# Patient Record
Sex: Female | Born: 1945 | Race: White | Hispanic: No | Smoking: Former smoker
Health system: Southern US, Community
[De-identification: ages and names within clinical notes are randomized; demographics above are authoritative.]

## PROBLEM LIST (undated history)

## (undated) DIAGNOSIS — B019 Varicella without complication: Secondary | ICD-10-CM

## (undated) DIAGNOSIS — K219 Gastro-esophageal reflux disease without esophagitis: Secondary | ICD-10-CM

## (undated) DIAGNOSIS — I10 Essential (primary) hypertension: Secondary | ICD-10-CM

## (undated) DIAGNOSIS — E7800 Pure hypercholesterolemia, unspecified: Secondary | ICD-10-CM

## (undated) DIAGNOSIS — C541 Malignant neoplasm of endometrium: Secondary | ICD-10-CM

## (undated) HISTORY — PX: OOPHORECTOMY: SHX86

## (undated) HISTORY — DX: Malignant neoplasm of endometrium: C54.1

## (undated) HISTORY — DX: Varicella without complication: B01.9

## (undated) HISTORY — DX: Pure hypercholesterolemia, unspecified: E78.00

## (undated) HISTORY — DX: Gastro-esophageal reflux disease without esophagitis: K21.9

## (undated) HISTORY — PX: APPENDECTOMY: SHX54

## (undated) HISTORY — DX: Essential (primary) hypertension: I10

## (undated) HISTORY — PX: ABDOMINAL HYSTERECTOMY: SHX81

---

## 2002-05-11 ENCOUNTER — Other Ambulatory Visit: Admission: RE | Admit: 2002-05-11 | Discharge: 2002-05-11 | Payer: Self-pay | Admitting: Obstetrics & Gynecology

## 2002-06-17 ENCOUNTER — Ambulatory Visit: Admission: RE | Admit: 2002-06-17 | Discharge: 2002-06-17 | Payer: Self-pay | Admitting: Gynecology

## 2002-06-17 ENCOUNTER — Ambulatory Visit (HOSPITAL_COMMUNITY): Admission: RE | Admit: 2002-06-17 | Discharge: 2002-06-17 | Payer: Self-pay | Admitting: Obstetrics & Gynecology

## 2002-06-17 ENCOUNTER — Encounter: Payer: Self-pay | Admitting: Obstetrics & Gynecology

## 2004-12-27 ENCOUNTER — Ambulatory Visit: Payer: Self-pay | Admitting: Internal Medicine

## 2005-09-11 ENCOUNTER — Ambulatory Visit: Payer: Self-pay | Admitting: Gastroenterology

## 2006-02-05 ENCOUNTER — Ambulatory Visit: Payer: Self-pay | Admitting: Internal Medicine

## 2007-02-11 ENCOUNTER — Ambulatory Visit: Payer: Self-pay | Admitting: Internal Medicine

## 2008-03-15 ENCOUNTER — Ambulatory Visit: Payer: Self-pay | Admitting: Internal Medicine

## 2009-04-27 ENCOUNTER — Ambulatory Visit: Payer: Self-pay | Admitting: Internal Medicine

## 2009-12-28 ENCOUNTER — Encounter: Payer: Self-pay | Admitting: Physician Assistant

## 2010-07-31 ENCOUNTER — Ambulatory Visit: Payer: Self-pay | Admitting: Internal Medicine

## 2010-08-04 ENCOUNTER — Ambulatory Visit: Payer: Self-pay | Admitting: Internal Medicine

## 2010-08-16 ENCOUNTER — Ambulatory Visit: Payer: Self-pay | Admitting: Internal Medicine

## 2010-08-20 LAB — HM COLONOSCOPY

## 2010-08-24 ENCOUNTER — Ambulatory Visit: Payer: Self-pay | Admitting: Internal Medicine

## 2010-09-01 ENCOUNTER — Ambulatory Visit: Payer: Self-pay | Admitting: Internal Medicine

## 2010-09-04 ENCOUNTER — Ambulatory Visit: Payer: Self-pay | Admitting: Unknown Physician Specialty

## 2010-09-24 ENCOUNTER — Ambulatory Visit: Payer: Self-pay | Admitting: Internal Medicine

## 2010-10-31 ENCOUNTER — Ambulatory Visit: Payer: Self-pay | Admitting: Internal Medicine

## 2010-11-23 ENCOUNTER — Ambulatory Visit: Payer: Self-pay | Admitting: Internal Medicine

## 2011-10-09 ENCOUNTER — Ambulatory Visit: Payer: Self-pay | Admitting: Internal Medicine

## 2012-07-21 ENCOUNTER — Telehealth: Payer: Self-pay | Admitting: Internal Medicine

## 2012-07-21 NOTE — Telephone Encounter (Signed)
Caller: Macaria/Patient; Phone: [phone]; Reason for Call: Patient of Dr.  Roby Lofts moving with her to [facility].  Hasn't seen her in this office yet.  Next appt 08-11-12.  Will need refills for Avalide 150/12. 5 and HCTZ 12.  5 prior to visit please.  Pharmacy [facility]: [phone].

## 2012-07-21 NOTE — Telephone Encounter (Signed)
Ok to refill both x 2.

## 2012-07-22 MED ORDER — IRBESARTAN-HYDROCHLOROTHIAZIDE 150-12.5 MG PO TABS: 1.0000 | ORAL_TABLET | Freq: Every day | ORAL | Status: DC

## 2012-07-22 NOTE — Telephone Encounter (Signed)
Called pharmacy to request refills for patient.

## 2012-08-11 ENCOUNTER — Encounter: Payer: Self-pay | Admitting: Internal Medicine

## 2012-08-11 ENCOUNTER — Ambulatory Visit (INDEPENDENT_AMBULATORY_CARE_PROVIDER_SITE_OTHER): Payer: Medicare Other | Admitting: Internal Medicine

## 2012-08-11 VITALS — BP 124/76 | HR 68 | Temp 98.8°F | Ht 63.5 in | Wt 212.5 lb

## 2012-08-11 DIAGNOSIS — G4730 Sleep apnea, unspecified: Secondary | ICD-10-CM

## 2012-08-11 DIAGNOSIS — E7800 Pure hypercholesterolemia, unspecified: Secondary | ICD-10-CM | POA: Insufficient documentation

## 2012-08-11 DIAGNOSIS — I10 Essential (primary) hypertension: Secondary | ICD-10-CM | POA: Insufficient documentation

## 2012-08-11 DIAGNOSIS — R7309 Other abnormal glucose: Secondary | ICD-10-CM

## 2012-08-11 DIAGNOSIS — C541 Malignant neoplasm of endometrium: Secondary | ICD-10-CM

## 2012-08-11 DIAGNOSIS — Z8542 Personal history of malignant neoplasm of other parts of uterus: Secondary | ICD-10-CM | POA: Insufficient documentation

## 2012-08-11 DIAGNOSIS — R739 Hyperglycemia, unspecified: Secondary | ICD-10-CM | POA: Insufficient documentation

## 2012-08-11 DIAGNOSIS — C549 Malignant neoplasm of corpus uteri, unspecified: Secondary | ICD-10-CM

## 2012-08-11 MED ORDER — HYDROCHLOROTHIAZIDE 12.5 MG PO CAPS: 12.5000 mg | ORAL_CAPSULE | Freq: Every day | ORAL | Status: DC

## 2012-08-11 MED ORDER — IRBESARTAN-HYDROCHLOROTHIAZIDE 150-12.5 MG PO TABS: 1.0000 | ORAL_TABLET | Freq: Every day | ORAL | Status: DC

## 2012-08-11 NOTE — Patient Instructions (Signed)
It was nice seeing you today.  I am glad you have been doing well.  Let me know if you need anything. 

## 2012-08-24 ENCOUNTER — Encounter: Payer: Self-pay | Admitting: Internal Medicine

## 2012-08-24 DIAGNOSIS — G4730 Sleep apnea, unspecified: Secondary | ICD-10-CM | POA: Insufficient documentation

## 2012-08-24 NOTE — Assessment & Plan Note (Signed)
Continue CPAP.  Follow.   

## 2012-08-24 NOTE — Assessment & Plan Note (Signed)
Low cholesterol diet.  Check lipid panel with next labs.  

## 2012-08-24 NOTE — Assessment & Plan Note (Signed)
Blood pressure under good control.  Same meds.  Check metabolic panel with next labs.    

## 2012-08-24 NOTE — Assessment & Plan Note (Signed)
Low carb diet.  Continue exercise.  Weight loss.  Check met b and a1c.

## 2012-08-24 NOTE — Assessment & Plan Note (Signed)
Followed by GYN - Dr Jackson-Moore.  Up to date.    

## 2012-08-24 NOTE — Progress Notes (Signed)
Subjective:    Patient ID: Barbara Burns, female    DOB: 02-Jun-1946, 66 y.o.   MRN: [id number]  HPI 66 year old female with past history of hypertension, hyperglycemia and hypercholesterolemia who comes in today for a scheduled follow up.  She states she is doing relatively well.  Going to [employer].  Feels better exercising.  No chest pain of tightness with increased activity or exertion.  Bowels stable.  Up to date with her GYN exams and mammograms.  Just had her mammogram this past summer.  Blood pressures have been doing well - averaging 118-136/70s.  She is still having trouble with her heels.  Saw podiatry.  They are following.    Past Medical History  Diagnosis Date  . Hypertension   . Hypercholesterolemia   . Endometrial cancer     s/p hysterectomy - 2003  . GERD (gastroesophageal reflux disease)   . Chicken pox     Outpatient Encounter Prescriptions as of 08/11/2012  Medication Sig Dispense Refill  . aspirin 81 MG tablet Take 81 mg by mouth daily.      . Calcium Carbonate (CALCIUM 600 PO) Take 1 tablet by mouth 2 (two) times daily.      . fexofenadine (ALLEGRA) 180 MG tablet Take 180 mg by mouth daily.      . fish oil-omega-3 fatty acids 1000 MG capsule Take 1 g by mouth daily.      . hydrochlorothiazide (MICROZIDE) 12.5 MG capsule Take 1 capsule (12.5 mg total) by mouth daily.  90 capsule  3  . irbesartan-hydrochlorothiazide (AVALIDE) 150-12.5 MG per tablet Take 1 tablet by mouth daily.  90 tablet  3  . omeprazole (PRILOSEC) 20 MG capsule Take 20 mg by mouth daily.      . [DISCONTINUED] hydrochlorothiazide (MICROZIDE) 12.5 MG capsule Take 12.5 mg by mouth daily.      . [DISCONTINUED] irbesartan-hydrochlorothiazide (AVALIDE) 150-12.5 MG per tablet Take 1 tablet by mouth daily.  30 tablet  2  . fluticasone (FLONASE) 50 MCG/ACT nasal spray Place 2 sprays into the nose daily.        Review of Systems Patient denies any headache, lightheadedness or dizziness.  No significant sinus  or allergy symptoms.  No chest pain, tightness or palpitations.  No increased shortness of breath, cough or congestion.  No nausea or vomiting.  No increased acid reflux.   No abdominal pain or cramping.  No bowel change, such as diarrhea, constipation, BRBPR or melana.  No urine change.  No vaginal problems.  Persistent problem with heel pain.       Objective:   Physical Exam Filed Vitals:   08/11/12 1422  BP: 124/76  Pulse: 68  Temp: 98.8 F (29.44 C)   66 year old female in no acute distress.   HEENT:  Nares - clear.  OP- without lesions or erythema.  NECK:  Supple, nontender.  No audible bruit.   HEART:  Appears to be regular. LUNGS:  Without crackles or wheezing audible.  Respirations even and unlabored.   RADIAL PULSE:  Equal bilaterally.  ABDOMEN:  Soft, nontender.  No audible abdominal bruit.   EXTREMITIES:  No increased edema to be present.                     Assessment & Plan:  HEEL PAIN.  Continue follow up with podiatry.    CARDIOVASCULAR.  Currently asymptomatic.  Exercising.  Continue risk factor modification.   GI.  Colonoscopy 09/04/10 revealed diverticulosis.  Upper symptoms controlled.  Follow.   INCREASED PSYCHOSOCIAL STRESSORS.  Doing well.  Follow.   HEALTH MAINTENANCE.  Physical 10/19/10.  Pelvics and paps through GYN (Dr Tamela Oddi).  Colonoscopy as outlined.  Mammogram 10/09/11 - BiRADS II.

## 2012-08-28 ENCOUNTER — Other Ambulatory Visit (INDEPENDENT_AMBULATORY_CARE_PROVIDER_SITE_OTHER): Payer: Medicare Other

## 2012-08-28 DIAGNOSIS — I10 Essential (primary) hypertension: Secondary | ICD-10-CM

## 2012-08-28 DIAGNOSIS — E7800 Pure hypercholesterolemia, unspecified: Secondary | ICD-10-CM

## 2012-08-28 DIAGNOSIS — R7309 Other abnormal glucose: Secondary | ICD-10-CM

## 2012-08-28 DIAGNOSIS — C541 Malignant neoplasm of endometrium: Secondary | ICD-10-CM

## 2012-08-28 DIAGNOSIS — R739 Hyperglycemia, unspecified: Secondary | ICD-10-CM

## 2012-08-28 DIAGNOSIS — C549 Malignant neoplasm of corpus uteri, unspecified: Secondary | ICD-10-CM

## 2012-08-28 LAB — HEPATIC FUNCTION PANEL
ALT: 49 U/L — ABNORMAL HIGH (ref 0–35)
Albumin: 3.8 g/dL (ref 3.5–5.2)
Total Bilirubin: 1.3 mg/dL — ABNORMAL HIGH (ref 0.3–1.2)
Total Protein: 7.3 g/dL (ref 6.0–8.3)

## 2012-08-28 LAB — BASIC METABOLIC PANEL
GFR: 68.51 mL/min (ref >60.00)
Glucose, Bld: 124 mg/dL — ABNORMAL HIGH (ref 70–99)
Potassium: 3.6 mEq/L (ref 3.5–5.1)
Sodium: 138 mEq/L (ref 135–145)

## 2012-08-28 LAB — LIPID PANEL
HDL: 41.4 mg/dL (ref >39.00)
Triglycerides: 111 mg/dL (ref 0.0–149.0)

## 2012-08-28 LAB — LDL CHOLESTEROL, DIRECT: Direct LDL: 164.8 mg/dL

## 2012-08-29 ENCOUNTER — Telehealth: Payer: Self-pay | Admitting: General Practice

## 2012-08-29 NOTE — Telephone Encounter (Signed)
Please call this number [phone] if no one answers please leave a message for the reason you are calling.

## 2012-08-29 NOTE — Telephone Encounter (Signed)
Pt called to get lab results. Stated she received several calls yesterday. If you call during the day please use [phone]. If after 5pm please call [phone].

## 2012-08-30 ENCOUNTER — Telehealth: Payer: Self-pay | Admitting: Internal Medicine

## 2012-08-30 NOTE — Telephone Encounter (Signed)
Pt notified of lab results and need for follow up liver panel.  Will recheck 09/09/12 - 8:00.  Pt aware - just need to put on lab schedule.  Thanks.

## 2012-09-02 NOTE — Telephone Encounter (Signed)
Made appointment

## 2012-09-08 ENCOUNTER — Telehealth: Payer: Self-pay | Admitting: *Deleted

## 2012-09-08 NOTE — Telephone Encounter (Signed)
This pt is coming in for labs tomorrow (Dec. 17 th, 2013) What labs and diagnostic would you like for this pt? Thank you

## 2012-09-09 ENCOUNTER — Other Ambulatory Visit: Payer: Self-pay | Admitting: *Deleted

## 2012-09-09 ENCOUNTER — Other Ambulatory Visit (INDEPENDENT_AMBULATORY_CARE_PROVIDER_SITE_OTHER): Payer: Medicare Other

## 2012-09-09 DIAGNOSIS — R945 Abnormal results of liver function studies: Secondary | ICD-10-CM

## 2012-09-09 LAB — HEPATIC FUNCTION PANEL
Albumin: 4 g/dL (ref 3.5–5.2)
Alkaline Phosphatase: 68 U/L (ref 39–117)
Total Protein: 7.4 g/dL (ref 6.0–8.3)

## 2012-09-09 NOTE — Telephone Encounter (Signed)
Just needs liver panel checked (dx 794.8).  Thanks.

## 2012-09-11 ENCOUNTER — Telehealth: Payer: Self-pay | Admitting: Internal Medicine

## 2012-09-11 DIAGNOSIS — R945 Abnormal results of liver function studies: Secondary | ICD-10-CM

## 2012-09-11 NOTE — Telephone Encounter (Signed)
Pt notified of lab results.  I placed order for follow up labs on 10/06/12 - 8:00.  Pt aware of time.  Please put on schedule.  Thanks.

## 2012-09-15 ENCOUNTER — Ambulatory Visit: Payer: Self-pay | Admitting: Internal Medicine

## 2012-09-15 NOTE — Telephone Encounter (Signed)
Scheduled

## 2012-09-16 ENCOUNTER — Telehealth: Payer: Self-pay | Admitting: Internal Medicine

## 2012-09-16 NOTE — Telephone Encounter (Signed)
Left detailed message on patient's answering service per patients request.

## 2012-09-16 NOTE — Telephone Encounter (Signed)
Notify pt abdominal ultrasound reveals fatty liver.  Normal gallbladder.  No other acute abnormalities noted.  Continue diet, exercise and weight loss.

## 2012-10-06 ENCOUNTER — Other Ambulatory Visit: Payer: Medicare Other

## 2012-10-09 ENCOUNTER — Other Ambulatory Visit (INDEPENDENT_AMBULATORY_CARE_PROVIDER_SITE_OTHER): Payer: Medicare Other

## 2012-10-09 DIAGNOSIS — R7989 Other specified abnormal findings of blood chemistry: Secondary | ICD-10-CM

## 2012-10-09 DIAGNOSIS — R945 Abnormal results of liver function studies: Secondary | ICD-10-CM

## 2012-10-09 LAB — HEPATIC FUNCTION PANEL
AST: 32 U/L (ref 0–37)
Albumin: 3.8 g/dL (ref 3.5–5.2)
Alkaline Phosphatase: 69 U/L (ref 39–117)
Total Bilirubin: 1.2 mg/dL (ref 0.3–1.2)

## 2012-10-11 ENCOUNTER — Other Ambulatory Visit: Payer: Self-pay | Admitting: Internal Medicine

## 2012-10-11 DIAGNOSIS — R7989 Other specified abnormal findings of blood chemistry: Secondary | ICD-10-CM

## 2012-10-11 DIAGNOSIS — R739 Hyperglycemia, unspecified: Secondary | ICD-10-CM

## 2012-10-11 DIAGNOSIS — R945 Abnormal results of liver function studies: Secondary | ICD-10-CM

## 2012-10-11 DIAGNOSIS — C541 Malignant neoplasm of endometrium: Secondary | ICD-10-CM

## 2012-10-11 DIAGNOSIS — R5383 Other fatigue: Secondary | ICD-10-CM

## 2012-10-11 DIAGNOSIS — I10 Essential (primary) hypertension: Secondary | ICD-10-CM

## 2012-10-11 DIAGNOSIS — E7800 Pure hypercholesterolemia, unspecified: Secondary | ICD-10-CM

## 2012-10-11 NOTE — Progress Notes (Signed)
Orders placed for follow up lab.

## 2012-10-13 ENCOUNTER — Encounter: Payer: Self-pay | Admitting: Internal Medicine

## 2012-10-15 ENCOUNTER — Encounter: Payer: Self-pay | Admitting: *Deleted

## 2012-11-15 ENCOUNTER — Telehealth: Payer: Self-pay | Admitting: Internal Medicine

## 2012-11-15 MED ORDER — IRBESARTAN-HYDROCHLOROTHIAZIDE 150-12.5 MG PO TABS: 1.0000 | ORAL_TABLET | Freq: Every day | ORAL | Status: DC

## 2012-11-18 ENCOUNTER — Telehealth: Payer: Self-pay | Admitting: *Deleted

## 2012-11-18 NOTE — Telephone Encounter (Signed)
Left message for patient to return call.

## 2012-11-18 NOTE — Telephone Encounter (Signed)
OK to refill? Not on med list. 

## 2012-11-18 NOTE — Telephone Encounter (Signed)
See rx request. 

## 2012-11-18 NOTE — Telephone Encounter (Signed)
Refilled blood pressure medication

## 2012-11-18 NOTE — Telephone Encounter (Signed)
Refill request  Ibuprofen 800 mg tablet  #40  Take 1 tablet by mouth every 8 hours as needed for pain

## 2012-11-19 NOTE — Telephone Encounter (Signed)
Patient returned your call.

## 2012-11-20 MED ORDER — HYDROCHLOROTHIAZIDE 12.5 MG PO CAPS: 12.5000 mg | ORAL_CAPSULE | Freq: Every day | ORAL | Status: DC

## 2012-11-20 NOTE — Telephone Encounter (Signed)
Patient stated that she does not take the ibuprofen 800 mg. She is needing HCTZ refill. Sent in to pharmacy.

## 2012-12-08 ENCOUNTER — Telehealth: Payer: Self-pay | Admitting: Internal Medicine

## 2012-12-08 NOTE — Telephone Encounter (Signed)
Called patient to find out which pharmacy she wants her medication sent in to?

## 2012-12-08 NOTE — Telephone Encounter (Signed)
irbesartan-hydrochlorothiazide (AVALIDE) 150-12.5 MG per tablet   #90  Patient is out of medication

## 2012-12-08 NOTE — Telephone Encounter (Signed)
[facility] uses [facility] [place]

## 2012-12-09 MED ORDER — IRBESARTAN-HYDROCHLOROTHIAZIDE 150-12.5 MG PO TABS: 1.0000 | ORAL_TABLET | Freq: Every day | ORAL | Status: DC

## 2012-12-09 NOTE — Telephone Encounter (Signed)
Resent prescription in to pharmacy.

## 2013-01-14 IMAGING — US ABDOMEN ULTRASOUND
1 series · 14 of 25 positions shown · non-contrast
Comparison: none

REASON FOR EXAM: Abnormal liver function test
COMMENTS:

PROCEDURE:     US  - US ABDOMEN GENERAL SURVEY  - September 15, 2012  [DATE]
RESULT:     Comparison: None.
TECHNIQUE: Multiple grayscale and color Doppler images were obtained of the
abdomen.

[Series 1: abdomen ultrasound · 0.26mm/px · 14 of 130 slices shown]
[im 1/130]
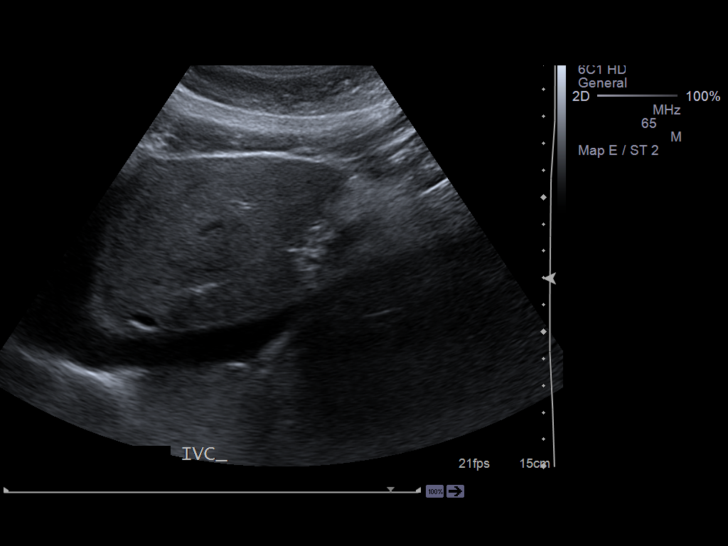
[im 11/130]
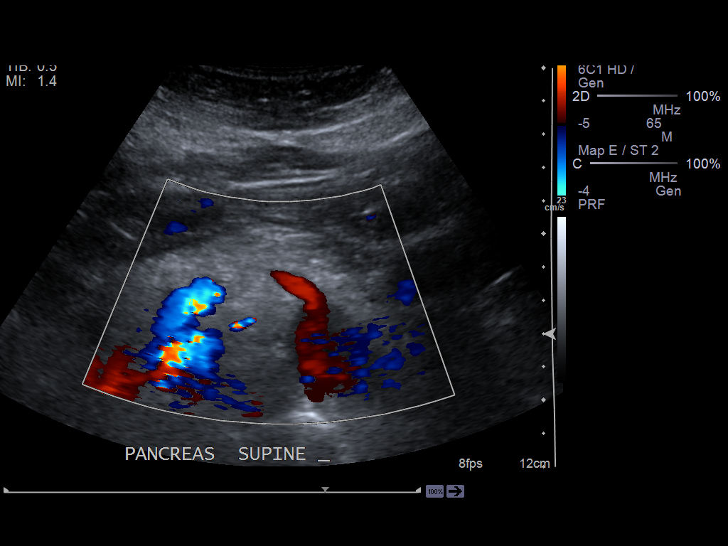
[im 22/130]
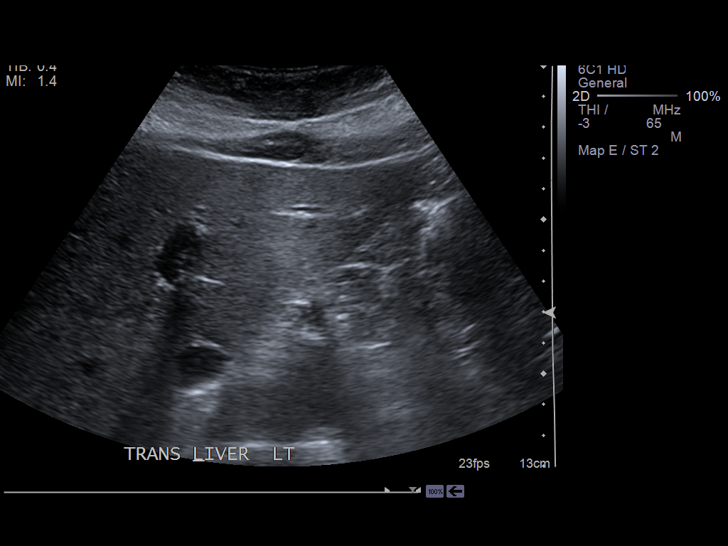
[im 33/130]
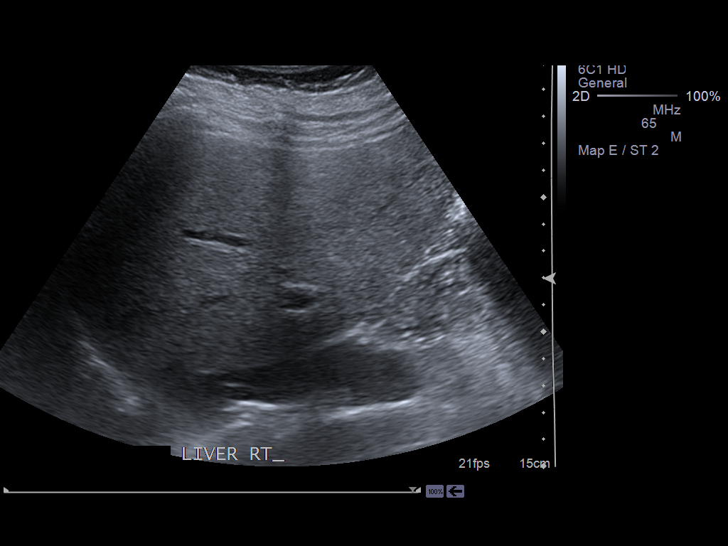
[im 44/130]
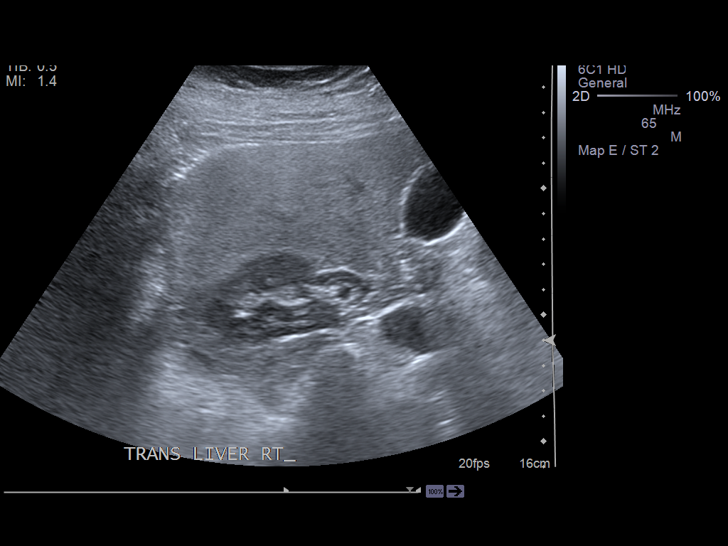
[im 49/130]
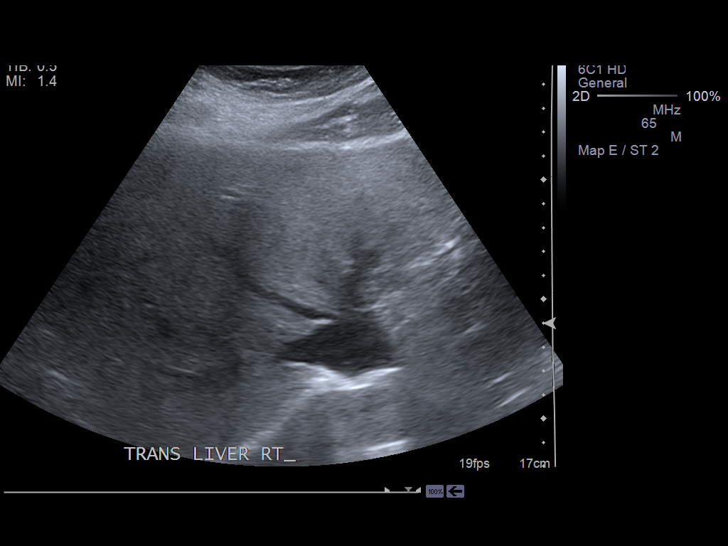
[im 60/130]
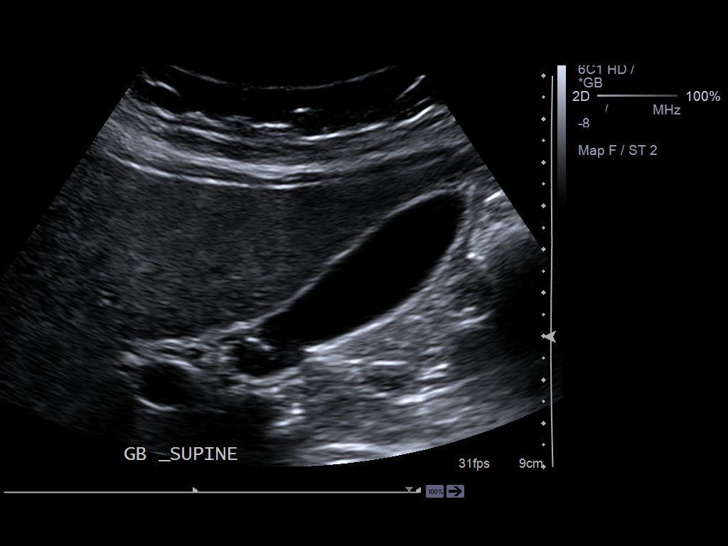
[im 70/130]
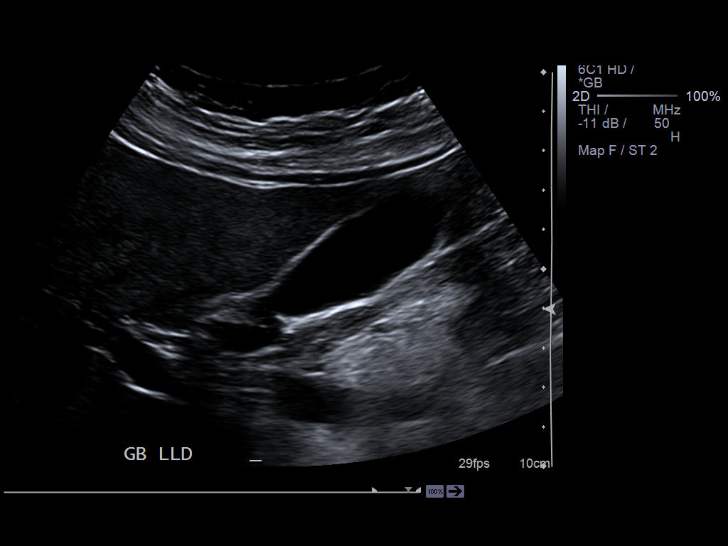
[im 81/130]
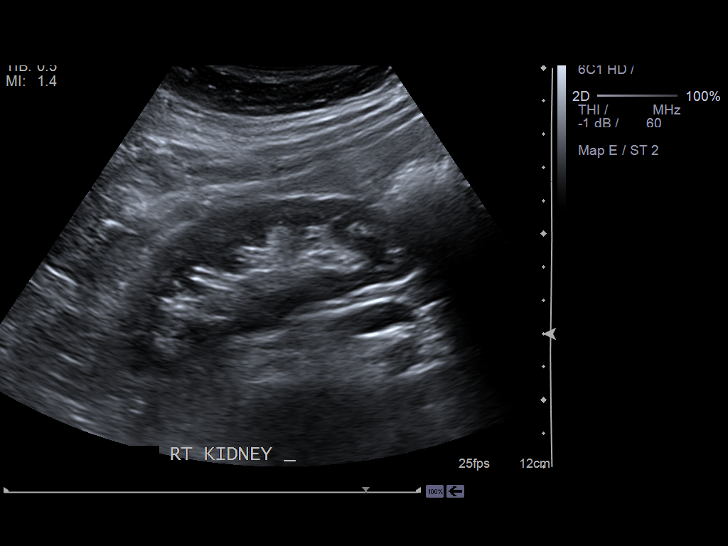
[im 87/130]
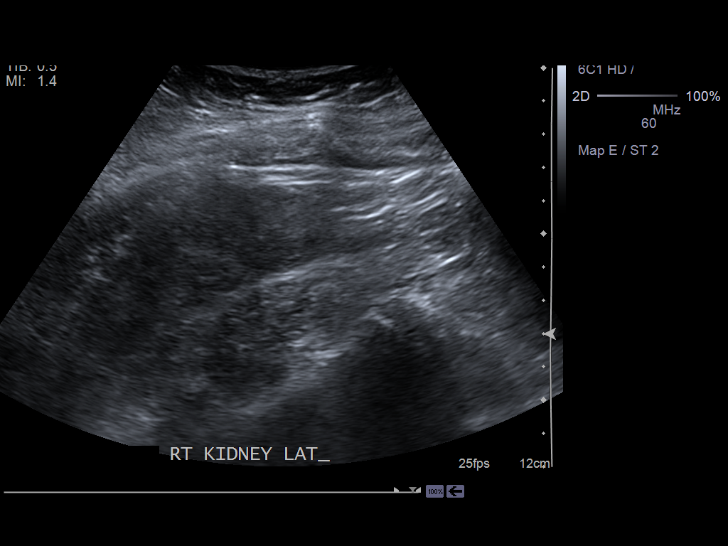
[im 97/130]
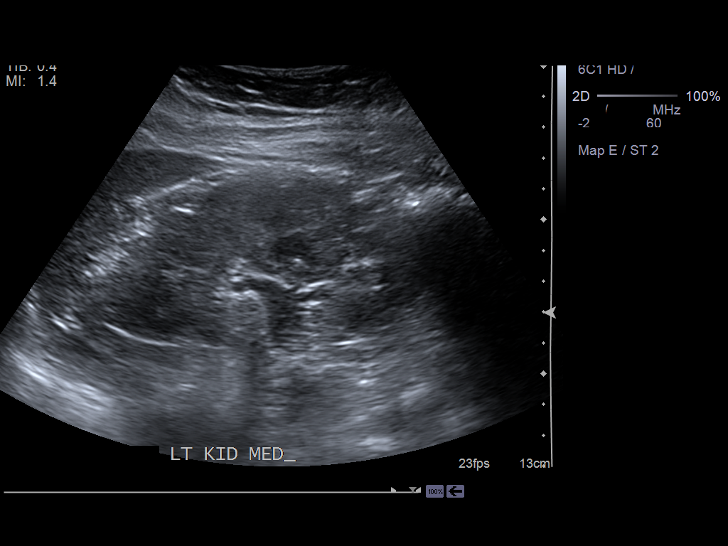
[im 108/130]
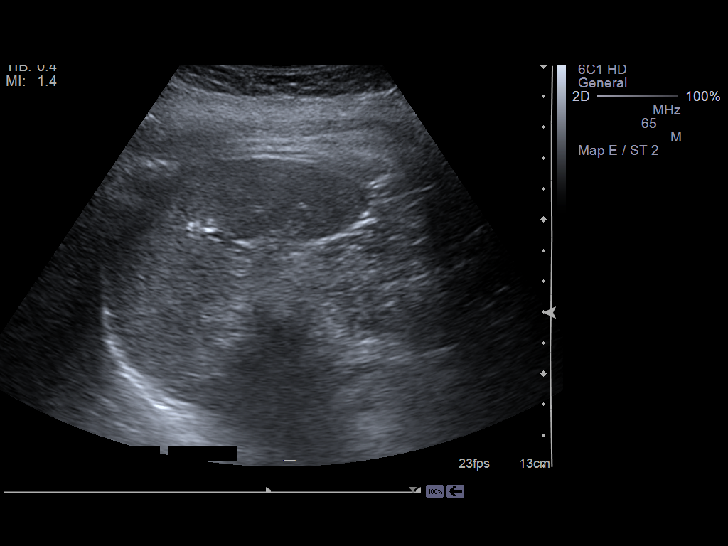
[im 119/130]
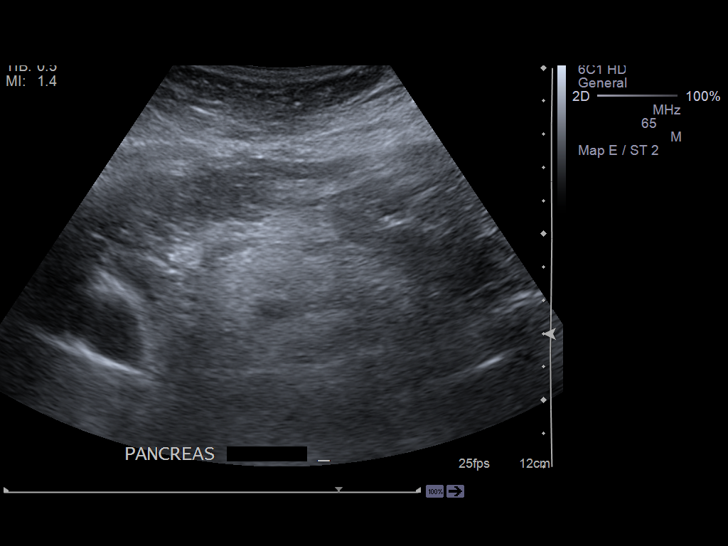
[im 130/130]
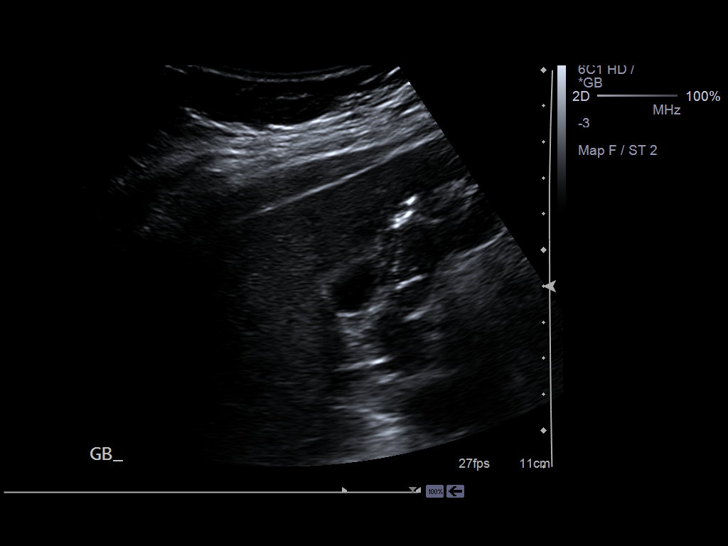

[14 of 25 positions shown; findings below may reference images not displayed]

FINDINGS: The abdominal aorta is normal in caliber. The pancreas was partially
obscured by overlying bowel gas. The visualized portion of the pancreatic
body is unremarkable. The liver is relatively hyperechoic, as can be seen
with hepatic steatosis. The main portal vein is patent. The common bile duct
measures 4 mm in diameter. The gallbladder is normal. Sonographic Murphy
sign was negative.

The visualized spleen is unremarkable. Images of the kidneys showed no
hydronephrosis. The left kidney is somewhat lobular in contour, which is
nonspecific.
IMPRESSION: 1. Hepatic steatosis.
2. Normal gallbladder.

[REDACTED]

## 2013-01-30 ENCOUNTER — Ambulatory Visit: Payer: Self-pay | Admitting: Internal Medicine

## 2013-01-30 LAB — HM MAMMOGRAPHY

## 2013-02-05 ENCOUNTER — Other Ambulatory Visit (INDEPENDENT_AMBULATORY_CARE_PROVIDER_SITE_OTHER): Payer: Medicare Other

## 2013-02-05 DIAGNOSIS — R7309 Other abnormal glucose: Secondary | ICD-10-CM

## 2013-02-05 DIAGNOSIS — R5381 Other malaise: Secondary | ICD-10-CM

## 2013-02-05 DIAGNOSIS — I10 Essential (primary) hypertension: Secondary | ICD-10-CM

## 2013-02-05 DIAGNOSIS — R7989 Other specified abnormal findings of blood chemistry: Secondary | ICD-10-CM

## 2013-02-05 DIAGNOSIS — C549 Malignant neoplasm of corpus uteri, unspecified: Secondary | ICD-10-CM

## 2013-02-05 DIAGNOSIS — R945 Abnormal results of liver function studies: Secondary | ICD-10-CM

## 2013-02-05 DIAGNOSIS — E7800 Pure hypercholesterolemia, unspecified: Secondary | ICD-10-CM

## 2013-02-05 DIAGNOSIS — R5383 Other fatigue: Secondary | ICD-10-CM

## 2013-02-05 DIAGNOSIS — C541 Malignant neoplasm of endometrium: Secondary | ICD-10-CM

## 2013-02-05 DIAGNOSIS — R739 Hyperglycemia, unspecified: Secondary | ICD-10-CM

## 2013-02-05 LAB — CBC WITH DIFFERENTIAL/PLATELET
Basophils Absolute: 0.1 10*3/uL (ref 0.0–0.1)
Basophils Relative: 0.7 % (ref 0.0–3.0)
Eosinophils Absolute: 0.2 10*3/uL (ref 0.0–0.7)
Hemoglobin: 14.5 g/dL (ref 12.0–15.0)
MCHC: 34.9 g/dL (ref 30.0–36.0)
MCV: 87.5 fl (ref 78.0–100.0)
Monocytes Absolute: 0.7 10*3/uL (ref 0.1–1.0)
Neutro Abs: 4 10*3/uL (ref 1.4–7.7)
RBC: 4.75 Mil/uL (ref 3.87–5.11)
RDW: 12.9 % (ref 11.5–14.6)

## 2013-02-05 LAB — HEPATIC FUNCTION PANEL
ALT: 34 U/L (ref 0–35)
AST: 25 U/L (ref 0–37)
Alkaline Phosphatase: 64 U/L (ref 39–117)
Bilirubin, Direct: 0.2 mg/dL (ref 0.0–0.3)
Total Bilirubin: 1.3 mg/dL — ABNORMAL HIGH (ref 0.3–1.2)
Total Protein: 6.9 g/dL (ref 6.0–8.3)

## 2013-02-05 LAB — BASIC METABOLIC PANEL
CO2: 33 mEq/L — ABNORMAL HIGH (ref 19–32)
Calcium: 9.5 mg/dL (ref 8.4–10.5)
Chloride: 102 mEq/L (ref 96–112)
Creatinine, Ser: 0.9 mg/dL (ref 0.4–1.2)
Glucose, Bld: 104 mg/dL — ABNORMAL HIGH (ref 70–99)
Sodium: 140 mEq/L (ref 135–145)

## 2013-02-05 LAB — TSH: TSH: 3.14 u[IU]/mL (ref 0.35–5.50)

## 2013-02-09 ENCOUNTER — Encounter: Payer: Self-pay | Admitting: Internal Medicine

## 2013-02-09 ENCOUNTER — Ambulatory Visit (INDEPENDENT_AMBULATORY_CARE_PROVIDER_SITE_OTHER): Payer: Medicare Other | Admitting: Internal Medicine

## 2013-02-09 VITALS — BP 110/70 | HR 76 | Temp 98.8°F | Ht 63.0 in | Wt 204.8 lb

## 2013-02-09 DIAGNOSIS — G4730 Sleep apnea, unspecified: Secondary | ICD-10-CM

## 2013-02-09 DIAGNOSIS — R739 Hyperglycemia, unspecified: Secondary | ICD-10-CM

## 2013-02-09 DIAGNOSIS — R7309 Other abnormal glucose: Secondary | ICD-10-CM

## 2013-02-09 DIAGNOSIS — C549 Malignant neoplasm of corpus uteri, unspecified: Secondary | ICD-10-CM

## 2013-02-09 DIAGNOSIS — I10 Essential (primary) hypertension: Secondary | ICD-10-CM

## 2013-02-09 DIAGNOSIS — R945 Abnormal results of liver function studies: Secondary | ICD-10-CM

## 2013-02-09 DIAGNOSIS — R7989 Other specified abnormal findings of blood chemistry: Secondary | ICD-10-CM

## 2013-02-09 DIAGNOSIS — C541 Malignant neoplasm of endometrium: Secondary | ICD-10-CM

## 2013-02-09 DIAGNOSIS — E7800 Pure hypercholesterolemia, unspecified: Secondary | ICD-10-CM

## 2013-02-09 MED ORDER — HYDROCHLOROTHIAZIDE 12.5 MG PO CAPS: 12.5000 mg | ORAL_CAPSULE | Freq: Every day | ORAL | Status: DC

## 2013-02-09 MED ORDER — IRBESARTAN-HYDROCHLOROTHIAZIDE 150-12.5 MG PO TABS: 1.0000 | ORAL_TABLET | Freq: Every day | ORAL | Status: DC

## 2013-02-16 ENCOUNTER — Encounter: Payer: Self-pay | Admitting: Internal Medicine

## 2013-02-16 NOTE — Assessment & Plan Note (Signed)
Continue CPAP.  Follow.   

## 2013-02-16 NOTE — Assessment & Plan Note (Signed)
Blood pressure under good control.  Same meds.  Follow metabolic panel.   

## 2013-02-16 NOTE — Assessment & Plan Note (Signed)
Low carb diet.  Continue exercise.  Weight loss.  Follow met b and a1c.  A1c just checked 02/05/13 - 5.8.

## 2013-02-16 NOTE — Assessment & Plan Note (Signed)
Low cholesterol diet.  Follow lipid panel.  Cholesterol checked 02/05/13 revealed total cholesterol 222, triglycerides 114, HDL 39 and LDL 151.  Improved some.  Will follow.

## 2013-02-16 NOTE — Assessment & Plan Note (Signed)
Followed by GYN - Dr Jackson-Moore.  Up to date.    

## 2013-02-16 NOTE — Progress Notes (Signed)
Subjective:    Patient ID: Barbara Burns, female    DOB: October 06, 1945, 67 y.o.   MRN: [id number]  HPI 67 year old female with past history of hypertension, hyperglycemia and hypercholesterolemia who comes in today to follow up on these issues as well as for her physical exam.  She states she is doing relatively well.  Going to [employer].  Feels better exercising.  No chest pain or tightness with increased activity or exertion.  Bowels stable.  Up to date with her GYN exams and mammograms.  Need results of her mammogram.  Watching her diet.  Blood pressures have been doing well.    Past Medical History  Diagnosis Date  . Hypertension   . Hypercholesterolemia   . Endometrial cancer     s/p hysterectomy - 2003  . GERD (gastroesophageal reflux disease)   . Chicken pox     Outpatient Encounter Prescriptions as of 02/09/2013  Medication Sig Dispense Refill  . aspirin 81 MG tablet Take 81 mg by mouth daily.      . Calcium Carbonate (CALCIUM 600 PO) Take 1 tablet by mouth 2 (two) times daily.      . fexofenadine (ALLEGRA) 180 MG tablet Take 180 mg by mouth daily.      . fish oil-omega-3 fatty acids 1000 MG capsule Take 1 g by mouth daily.      . hydrochlorothiazide (MICROZIDE) 12.5 MG capsule Take 1 capsule (12.5 mg total) by mouth daily.  90 capsule  3  . irbesartan-hydrochlorothiazide (AVALIDE) 150-12.5 MG per tablet Take 1 tablet by mouth daily.  90 tablet  3  . omeprazole (PRILOSEC) 20 MG capsule Take 20 mg by mouth daily.      . [DISCONTINUED] hydrochlorothiazide (MICROZIDE) 12.5 MG capsule Take 1 capsule (12.5 mg total) by mouth daily.  30 capsule  2  . [DISCONTINUED] irbesartan-hydrochlorothiazide (AVALIDE) 150-12.5 MG per tablet Take 1 tablet by mouth daily.  30 tablet  5  . fluticasone (FLONASE) 50 MCG/ACT nasal spray Place 2 sprays into the nose daily.       No facility-administered encounter medications on file as of 02/09/2013.    Review of Systems Patient denies any headache,  lightheadedness or dizziness.  No significant sinus or allergy symptoms.  No chest pain, tightness or palpitations.  No increased shortness of breath, cough or congestion.  No nausea or vomiting.  No increased acid reflux.   No abdominal pain or cramping.  No bowel change, such as diarrhea, constipation, BRBPR or melana.  No urine change.  No vaginal problems.  Sees gyn for her pelvic exam given her history of endometrial cancer.       Objective:   Physical Exam  Filed Vitals:   02/09/13 1356  BP: 110/70  Pulse: 76  Temp: 98.8 F (37.1 C)   Blood pressure recheck:  116/74, pulse 36  67 year old female in no acute distress.   HEENT:  Nares- clear.  Oropharynx - without lesions. NECK:  Supple.  Nontender.  No audible bruit.  HEART:  Appears to be regular. I/VI systolic murmur.  LUNGS:  No crackles or wheezing audible.  Respirations even and unlabored.  RADIAL PULSE:  Equal bilaterally.    BREASTS:  No nipple discharge or nipple retraction present.  Could not appreciate any distinct nodules or axillary adenopathy.  ABDOMEN:  Soft, nontender.  Bowel sounds present and normal.  No audible abdominal bruit.  GU: performed by gyn.  EXTREMITIES:  No increased edema present.  DP pulses  palpable and equal bilaterally.           Assessment & Plan:  CARDIOVASCULAR.  Currently asymptomatic.  Exercising.  Continue risk factor modification.   GI.  Colonoscopy 09/04/10 revealed diverticulosis.  Upper symptoms controlled.  Follow.   INCREASED PSYCHOSOCIAL STRESSORS.  Doing well.  Follow.   HEALTH MAINTENANCE.  Physical today.  Pelvics and paps through GYN (Dr Tamela Oddi).  Colonoscopy as outlined.  Mammogram 10/09/11 - BiRADS II.  States had a f/u mammogram for 2014.  Need results.

## 2013-02-24 ENCOUNTER — Encounter: Payer: Self-pay | Admitting: Internal Medicine

## 2013-03-03 ENCOUNTER — Encounter: Payer: Self-pay | Admitting: Internal Medicine

## 2013-05-12 ENCOUNTER — Other Ambulatory Visit (INDEPENDENT_AMBULATORY_CARE_PROVIDER_SITE_OTHER): Payer: Medicare Other

## 2013-05-12 DIAGNOSIS — R7989 Other specified abnormal findings of blood chemistry: Secondary | ICD-10-CM

## 2013-05-12 DIAGNOSIS — R945 Abnormal results of liver function studies: Secondary | ICD-10-CM

## 2013-05-12 LAB — HEPATIC FUNCTION PANEL
AST: 22 U/L (ref 0–37)
Total Bilirubin: 1.1 mg/dL (ref 0.3–1.2)

## 2013-05-13 ENCOUNTER — Encounter: Payer: Self-pay | Admitting: Internal Medicine

## 2013-07-30 ENCOUNTER — Other Ambulatory Visit: Payer: Self-pay

## 2013-08-10 ENCOUNTER — Telehealth: Payer: Self-pay | Admitting: *Deleted

## 2013-08-10 DIAGNOSIS — I10 Essential (primary) hypertension: Secondary | ICD-10-CM

## 2013-08-10 DIAGNOSIS — R739 Hyperglycemia, unspecified: Secondary | ICD-10-CM

## 2013-08-10 DIAGNOSIS — E7800 Pure hypercholesterolemia, unspecified: Secondary | ICD-10-CM

## 2013-08-10 NOTE — Telephone Encounter (Signed)
Pt is coming in for labs tomorrow what labs and dx?  

## 2013-08-10 NOTE — Telephone Encounter (Signed)
Order placed for labs.

## 2013-08-11 ENCOUNTER — Other Ambulatory Visit (INDEPENDENT_AMBULATORY_CARE_PROVIDER_SITE_OTHER): Payer: Medicare Other

## 2013-08-11 ENCOUNTER — Encounter: Payer: Self-pay | Admitting: Internal Medicine

## 2013-08-11 DIAGNOSIS — I10 Essential (primary) hypertension: Secondary | ICD-10-CM

## 2013-08-11 DIAGNOSIS — R7309 Other abnormal glucose: Secondary | ICD-10-CM

## 2013-08-11 DIAGNOSIS — E7800 Pure hypercholesterolemia, unspecified: Secondary | ICD-10-CM

## 2013-08-11 DIAGNOSIS — R739 Hyperglycemia, unspecified: Secondary | ICD-10-CM

## 2013-08-11 LAB — HEPATIC FUNCTION PANEL
ALT: 34 U/L (ref 0–35)
AST: 22 U/L (ref 0–37)
Albumin: 3.7 g/dL (ref 3.5–5.2)
Alkaline Phosphatase: 68 U/L (ref 39–117)
Total Protein: 7.1 g/dL (ref 6.0–8.3)

## 2013-08-11 LAB — BASIC METABOLIC PANEL
BUN: 17 mg/dL (ref 6–23)
Calcium: 9.3 mg/dL (ref 8.4–10.5)
Creatinine, Ser: 0.8 mg/dL (ref 0.4–1.2)
GFR: 76.25 mL/min (ref >60.00)
Glucose, Bld: 121 mg/dL — ABNORMAL HIGH (ref 70–99)

## 2013-08-11 LAB — LIPID PANEL
Cholesterol: 231 mg/dL — ABNORMAL HIGH (ref 0–200)
Total CHOL/HDL Ratio: 5

## 2013-08-12 ENCOUNTER — Encounter: Payer: Self-pay | Admitting: Internal Medicine

## 2013-08-17 ENCOUNTER — Ambulatory Visit (INDEPENDENT_AMBULATORY_CARE_PROVIDER_SITE_OTHER): Payer: Medicare Other | Admitting: Internal Medicine

## 2013-08-17 ENCOUNTER — Encounter: Payer: Self-pay | Admitting: Internal Medicine

## 2013-08-17 VITALS — BP 120/80 | HR 66 | Temp 98.6°F | Ht 63.0 in | Wt 214.8 lb

## 2013-08-17 DIAGNOSIS — Z23 Encounter for immunization: Secondary | ICD-10-CM

## 2013-08-17 DIAGNOSIS — I10 Essential (primary) hypertension: Secondary | ICD-10-CM

## 2013-08-17 DIAGNOSIS — R739 Hyperglycemia, unspecified: Secondary | ICD-10-CM

## 2013-08-17 DIAGNOSIS — G4730 Sleep apnea, unspecified: Secondary | ICD-10-CM

## 2013-08-17 DIAGNOSIS — E7800 Pure hypercholesterolemia, unspecified: Secondary | ICD-10-CM

## 2013-08-17 DIAGNOSIS — C541 Malignant neoplasm of endometrium: Secondary | ICD-10-CM

## 2013-08-17 DIAGNOSIS — R7309 Other abnormal glucose: Secondary | ICD-10-CM

## 2013-08-17 DIAGNOSIS — C549 Malignant neoplasm of corpus uteri, unspecified: Secondary | ICD-10-CM

## 2013-08-17 MED ORDER — IRBESARTAN-HYDROCHLOROTHIAZIDE 150-12.5 MG PO TABS: 1.0000 | ORAL_TABLET | Freq: Every day | ORAL | Status: DC

## 2013-08-17 MED ORDER — HYDROCHLOROTHIAZIDE 12.5 MG PO CAPS: 12.5000 mg | ORAL_CAPSULE | Freq: Every day | ORAL | Status: DC

## 2013-08-17 NOTE — Progress Notes (Signed)
Pre-visit discussion using our clinic review tool. No additional management support is needed unless otherwise documented below in the visit note.  

## 2013-08-20 ENCOUNTER — Encounter: Payer: Self-pay | Admitting: Internal Medicine

## 2013-08-20 NOTE — Assessment & Plan Note (Signed)
Blood pressure under good control.  Same meds.  Follow metabolic panel.   

## 2013-08-20 NOTE — Assessment & Plan Note (Signed)
Continue CPAP.  Follow.   

## 2013-08-20 NOTE — Assessment & Plan Note (Signed)
Low cholesterol diet.  Follow lipid panel.  Cholesterol checked 08/12/13 revealed total cholesterol 231, triglycerides 156, HDL 42 and LDL 154.  Improved some.  She declines cholesterol medication.

## 2013-08-20 NOTE — Assessment & Plan Note (Signed)
Followed by GYN - Dr Jackson-Moore.  Up to date.    

## 2013-08-20 NOTE — Assessment & Plan Note (Signed)
Low carb diet.  Continue exercise.  Weight loss.  Follow met b and a1c.  A1c just checked 08/12/13 - 6.1.  Sees Dr Clydene Pugh yearly.    Marland Kitchen

## 2013-08-20 NOTE — Progress Notes (Signed)
  Subjective:    Patient ID: Barbara Burns, female    DOB: [birth year], 67 y.o.   MRN: [id number]  HPI 67 year old female with past history of hypertension, hyperglycemia and hypercholesterolemia who comes in today for a scheduled follow up.  She states she is doing relatively well.   No chest pain or tightness with increased activity or exertion.  Bowels stable.  Up to date with her GYN exams and mammograms.   Watching her diet.  Blood pressures have been doing well.     Past Medical History  Diagnosis Date  . Hypertension   . Hypercholesterolemia   . Endometrial cancer     s/p hysterectomy - 2003  . GERD (gastroesophageal reflux disease)   . Chicken pox     Outpatient Encounter Prescriptions as of 08/17/2013  Medication Sig  . aspirin 81 MG tablet Take 81 mg by mouth daily.  . Calcium Carbonate (CALCIUM 600 PO) Take 1 tablet by mouth 2 (two) times daily.  . fexofenadine (ALLEGRA) 180 MG tablet Take 180 mg by mouth daily.  . fish oil-omega-3 fatty acids 1000 MG capsule Take 1 g by mouth daily.  . hydrochlorothiazide (MICROZIDE) 12.5 MG capsule Take 1 capsule (12.5 mg total) by mouth daily.  . irbesartan-hydrochlorothiazide (AVALIDE) 150-12.5 MG per tablet Take 1 tablet by mouth daily.  Marland Kitchen omeprazole (PRILOSEC) 20 MG capsule Take 20 mg by mouth daily.  . [DISCONTINUED] hydrochlorothiazide (MICROZIDE) 12.5 MG capsule Take 1 capsule (12.5 mg total) by mouth daily.  . [DISCONTINUED] irbesartan-hydrochlorothiazide (AVALIDE) 150-12.5 MG per tablet Take 1 tablet by mouth daily.  . [DISCONTINUED] fluticasone (FLONASE) 50 MCG/ACT nasal spray Place 2 sprays into the nose daily.    Review of Systems Patient denies any headache, lightheadedness or dizziness.  No significant sinus or allergy symptoms.  No chest pain, tightness or palpitations.  No increased shortness of breath, cough or congestion.  No nausea or vomiting.  No increased acid reflux.   No abdominal pain or cramping.  No bowel change,  such as diarrhea, constipation, BRBPR or melana.  No urine change.  No vaginal problems.  Sees gyn for her pelvic exam given her history of endometrial cancer.       Objective:   Physical Exam  Filed Vitals:   08/17/13 1355  BP: 120/80  Pulse: 66  Temp: 98.6 F (37 C)   Blood pressure recheck:  85/12  67 year old female in no acute distress.   HEENT:  Nares- clear.  Oropharynx - without lesions. NECK:  Supple.  Nontender.  No audible bruit.  HEART:  Appears to be regular. I/VI systolic murmur.  LUNGS:  No crackles or wheezing audible.  Respirations even and unlabored.  RADIAL PULSE:  Equal bilaterally.    BREASTS:  Performed by gyn.  ABDOMEN:  Soft, nontender.  Bowel sounds present and normal.  No audible abdominal bruit.  GU: performed by gyn.  EXTREMITIES:  No increased edema present.  DP pulses palpable and equal bilaterally.           Assessment & Plan:  CARDIOVASCULAR.  Currently asymptomatic.  Continue risk factor modification.   GI.  Colonoscopy 09/04/10 revealed diverticulosis.  Upper symptoms controlled.  Follow.   INCREASED PSYCHOSOCIAL STRESSORS.  Doing well.  Follow.   HEALTH MAINTENANCE.  Physical last visit.  Pelvics and paps through GYN (Dr Tamela Oddi).  Colonoscopy as outlined.  Mammogram 01/30/13 - BiRADS II.    Prevnar given today.

## 2013-10-15 ENCOUNTER — Encounter: Payer: Self-pay | Admitting: Internal Medicine

## 2013-10-19 ENCOUNTER — Ambulatory Visit (INDEPENDENT_AMBULATORY_CARE_PROVIDER_SITE_OTHER): Payer: Medicare Other | Admitting: Obstetrics & Gynecology

## 2013-10-19 ENCOUNTER — Encounter: Payer: Self-pay | Admitting: Obstetrics & Gynecology

## 2013-10-19 VITALS — BP 137/61 | HR 79 | Temp 97.8°F | Ht 63.0 in | Wt 214.0 lb

## 2013-10-19 DIAGNOSIS — Z01419 Encounter for gynecological examination (general) (routine) without abnormal findings: Secondary | ICD-10-CM

## 2013-10-19 NOTE — Progress Notes (Signed)
Subjective:     Barbara Burns is a 68 y.o. female here for a routine exam.  Current complaints: Patient is in the office today for an Annual exam.  Personal health questionnaire reviewed: yes.   Gynecologic History No LMP recorded. Patient has had a hysterectomy. Contraception: status post hysterectomy Last Pap: 2013. Results were: normal Last mammogram: 2014. Results were: normal   The following portions of the patient's history were reviewed and updated as appropriate: allergies, current medications, past family history, past medical history, past social history, past surgical history and problem list.  Review of Systems Pertinent items are noted in HPI.   Objective:   BP 137/61  Pulse 79  Temp(Src) 97.8 F (36.6 C)  Ht 5\' 3"  (1.6 m)  Wt 214 lb (97.07 kg)  BMI 37.92 kg/m2  General Appearance:    Alert, cooperative, no distress, appears stated age  Breast Exam:    No tenderness, masses, or nipple abnormality  Abdomen:     Soft, non-tender, bowel sounds active all four quadrants,    no masses, no organomegaly  Genitalia:    Normal female without lesion, discharge or tenderness    Assessment:   Healthy female exam Remote h/o early stage/grade 1 endometrial cancer   Plan:  Schedule  Baseline Bone Density Return in 1 yr

## 2013-10-19 NOTE — Patient Instructions (Addendum)
Follow for annual exam or as needed Schedule baseline Bone Density. Health Maintenance, Female A healthy lifestyle and preventative care can promote health and wellness.  Maintain regular health, dental, and eye exams.  Eat a healthy diet. Foods like vegetables, fruits, whole grains, low-fat dairy products, and lean protein foods contain the nutrients you need without too many calories. Decrease your intake of foods high in solid fats, added sugars, and salt. Get information about a proper diet from your caregiver, if necessary.  Regular physical exercise is one of the most important things you can do for your health. Most adults should get at least 150 minutes of moderate-intensity exercise (any activity that increases your heart rate and causes you to sweat) each week. In addition, most adults need muscle-strengthening exercises on 2 or more days a week.   Maintain a healthy weight. The body mass index (BMI) is a screening tool to identify possible weight problems. It provides an estimate of body fat based on height and weight. Your caregiver can help determine your BMI, and can help you achieve or maintain a healthy weight. For adults 20 years and older:  A BMI below 18.5 is considered underweight.  A BMI of 18.5 to 24.9 is normal.  A BMI of 25 to 29.9 is considered overweight.  A BMI of 30 and above is considered obese.  Maintain normal blood lipids and cholesterol by exercising and minimizing your intake of saturated fat. Eat a balanced diet with plenty of fruits and vegetables. Blood tests for lipids and cholesterol should begin at age 57 and be repeated every 5 years. If your lipid or cholesterol levels are high, you are over 50, or you are a high risk for heart disease, you may need your cholesterol levels checked more frequently.Ongoing high lipid and cholesterol levels should be treated with medicines if diet and exercise are not effective.  If you smoke, find out from your  caregiver how to quit. If you do not use tobacco, do not start.  Lung cancer screening is recommended for adults aged 60 80 years who are at high risk for developing lung cancer because of a history of smoking. Yearly low-dose computed tomography (CT) is recommended for people who have at least a 30-pack-year history of smoking and are a current smoker or have quit within the past 15 years. A pack year of smoking is smoking an average of 1 pack of cigarettes a day for 1 year (for example: 1 pack a day for 30 years or 2 packs a day for 15 years). Yearly screening should continue until the smoker has stopped smoking for at least 15 years. Yearly screening should also be stopped for people who develop a health problem that would prevent them from having lung cancer treatment.  If you are pregnant, do not drink alcohol. If you are breastfeeding, be very cautious about drinking alcohol. If you are not pregnant and choose to drink alcohol, do not exceed 1 drink per day. One drink is considered to be 12 ounces (355 mL) of beer, 5 ounces (148 mL) of wine, or 1.5 ounces (44 mL) of liquor.  Avoid use of street drugs. Do not share needles with anyone. Ask for help if you need support or instructions about stopping the use of drugs.  High blood pressure causes heart disease and increases the risk of stroke. Blood pressure should be checked at least every 1 to 2 years. Ongoing high blood pressure should be treated with medicines, if weight loss  and exercise are not effective.  If you are 30 to 67 years old, ask your caregiver if you should take aspirin to prevent strokes.  Diabetes screening involves taking a blood sample to check your fasting blood sugar level. This should be done once every 3 years, after age 68, if you are within normal weight and without risk factors for diabetes. Testing should be considered at a younger age or be carried out more frequently if you are overweight and have at least 1 risk factor  for diabetes.  Breast cancer screening is essential preventative care for women. You should practice "breast self-awareness." This means understanding the normal appearance and feel of your breasts and may include breast self-examination. Any changes detected, no matter how small, should be reported to a caregiver. Women in their 67s and 30s should have a clinical breast exam (CBE) by a caregiver as part of a regular health exam every 1 to 3 years. After age 57, women should have a CBE every year. Starting at age 94, women should consider having a mammogram (breast X-ray) every year. Women who have a family history of breast cancer should talk to their caregiver about genetic screening. Women at a high risk of breast cancer should talk to their caregiver about having an MRI and a mammogram every year.  Breast cancer gene (BRCA)-related cancer risk assessment is recommended for women who have family members with BRCA-related cancers. BRCA-related cancers include breast, ovarian, tubal, and peritoneal cancers. Having family members with these cancers may be associated with an increased risk for harmful changes (mutations) in the breast cancer genes BRCA1 and BRCA2. Results of the assessment will determine the need for genetic counseling and BRCA1 and BRCA2 testing.  The Pap test is a screening test for cervical cancer. Women should have a Pap test starting at age 46. Between ages 1 and 59, Pap tests should be repeated every 2 years. Beginning at age 7, you should have a Pap test every 3 years as long as the past 3 Pap tests have been normal. If you had a hysterectomy for a problem that was not cancer or a condition that could lead to cancer, then you no longer need Pap tests. If you are between ages 52 and 53, and you have had normal Pap tests going back 10 years, you no longer need Pap tests. If you have had past treatment for cervical cancer or a condition that could lead to cancer, you need Pap tests and  screening for cancer for at least 20 years after your treatment. If Pap tests have been discontinued, risk factors (such as a new sexual partner) need to be reassessed to determine if screening should be resumed. Some women have medical problems that increase the chance of getting cervical cancer. In these cases, your caregiver may recommend more frequent screening and Pap tests.  The human papillomavirus (HPV) test is an additional test that may be used for cervical cancer screening. The HPV test looks for the virus that can cause the cell changes on the cervix. The cells collected during the Pap test can be tested for HPV. The HPV test could be used to screen women aged 70 years and older, and should be used in women of any age who have unclear Pap test results. After the age of 50, women should have HPV testing at the same frequency as a Pap test.  Colorectal cancer can be detected and often prevented. Most routine colorectal cancer screening begins at the  age of 35 and continues through age 93. However, your caregiver may recommend screening at an earlier age if you have risk factors for colon cancer. On a yearly basis, your caregiver may provide home test kits to check for hidden blood in the stool. Use of a small camera at the end of a tube, to directly examine the colon (sigmoidoscopy or colonoscopy), can detect the earliest forms of colorectal cancer. Talk to your caregiver about this at age 31, when routine screening begins. Direct examination of the colon should be repeated every 5 to 10 years through age 68, unless early forms of pre-cancerous polyps or small growths are found.  Hepatitis C blood testing is recommended for all people born from [birth year] through 1964 and any individual with known risks for hepatitis C.  Practice safe sex. Use condoms and avoid high-risk sexual practices to reduce the spread of sexually transmitted infections (STIs). Sexually active women aged 49 and younger should be  checked for Chlamydia, which is a common sexually transmitted infection. Older women with new or multiple partners should also be tested for Chlamydia. Testing for other STIs is recommended if you are sexually active and at increased risk.  Osteoporosis is a disease in which the bones lose minerals and strength with aging. This can result in serious bone fractures. The risk of osteoporosis can be identified using a bone density scan. Women ages 20 and over and women at risk for fractures or osteoporosis should discuss screening with their caregivers. Ask your caregiver whether you should be taking a calcium supplement or vitamin D to reduce the rate of osteoporosis.  Menopause can be associated with physical symptoms and risks. Hormone replacement therapy is available to decrease symptoms and risks. You should talk to your caregiver about whether hormone replacement therapy is right for you.  Use sunscreen. Apply sunscreen liberally and repeatedly throughout the day. You should seek shade when your shadow is shorter than you. Protect yourself by wearing long sleeves, pants, a wide-brimmed hat, and sunglasses year round, whenever you are outdoors.  Notify your caregiver of new moles or changes in moles, especially if there is a change in shape or color. Also notify your caregiver if a mole is larger than the size of a pencil eraser.  Stay current with your immunizations. Document Released: 03/26/2011 Document Revised: 01/05/2013 Document Reviewed: 03/26/2011 [facility] Patient Information 2014 [place].

## 2014-02-09 ENCOUNTER — Other Ambulatory Visit (INDEPENDENT_AMBULATORY_CARE_PROVIDER_SITE_OTHER): Payer: Medicare Other

## 2014-02-09 DIAGNOSIS — R739 Hyperglycemia, unspecified: Secondary | ICD-10-CM

## 2014-02-09 DIAGNOSIS — C541 Malignant neoplasm of endometrium: Secondary | ICD-10-CM

## 2014-02-09 DIAGNOSIS — C549 Malignant neoplasm of corpus uteri, unspecified: Secondary | ICD-10-CM

## 2014-02-09 DIAGNOSIS — G4730 Sleep apnea, unspecified: Secondary | ICD-10-CM

## 2014-02-09 DIAGNOSIS — E7800 Pure hypercholesterolemia, unspecified: Secondary | ICD-10-CM

## 2014-02-09 DIAGNOSIS — I10 Essential (primary) hypertension: Secondary | ICD-10-CM

## 2014-02-09 DIAGNOSIS — R7309 Other abnormal glucose: Secondary | ICD-10-CM

## 2014-02-09 LAB — CBC WITH DIFFERENTIAL/PLATELET
BASOS ABS: 0.1 10*3/uL (ref 0.0–0.1)
Basophils Relative: 0.7 % (ref 0.0–3.0)
EOS ABS: 0 10*3/uL (ref 0.0–0.7)
Eosinophils Relative: 0 % (ref 0.0–5.0)
HCT: 43.6 % (ref 36.0–46.0)
HEMOGLOBIN: 14.8 g/dL (ref 12.0–15.0)
LYMPHS PCT: 24.9 % (ref 12.0–46.0)
Lymphs Abs: 3.3 10*3/uL (ref 0.7–4.0)
MCHC: 34 g/dL (ref 30.0–36.0)
MCV: 89.3 fl (ref 78.0–100.0)
MONO ABS: 0.6 10*3/uL (ref 0.1–1.0)
Monocytes Relative: 4.8 % (ref 3.0–12.0)
NEUTROS ABS: 9.2 10*3/uL — AB (ref 1.4–7.7)
Neutrophils Relative %: 69.6 % (ref 43.0–77.0)
Platelets: 328 10*3/uL (ref 150.0–400.0)
RBC: 4.88 Mil/uL (ref 3.87–5.11)
RDW: 13.1 % (ref 11.5–15.5)
WBC: 13.1 10*3/uL — ABNORMAL HIGH (ref 4.0–10.5)

## 2014-02-09 LAB — COMPREHENSIVE METABOLIC PANEL
ALK PHOS: 62 U/L (ref 39–117)
ALT: 30 U/L (ref 0–35)
AST: 20 U/L (ref 0–37)
Albumin: 3.9 g/dL (ref 3.5–5.2)
BUN: 20 mg/dL (ref 6–23)
CALCIUM: 9.6 mg/dL (ref 8.4–10.5)
CO2: 28 mEq/L (ref 19–32)
CREATININE: 0.9 mg/dL (ref 0.4–1.2)
Chloride: 105 mEq/L (ref 96–112)
GFR: 68.2 mL/min (ref >60.00)
Glucose, Bld: 120 mg/dL — ABNORMAL HIGH (ref 70–99)
Potassium: 3.8 mEq/L (ref 3.5–5.1)
Sodium: 139 mEq/L (ref 135–145)
Total Bilirubin: 0.9 mg/dL (ref 0.2–1.2)
Total Protein: 7.2 g/dL (ref 6.0–8.3)

## 2014-02-09 LAB — LIPID PANEL
CHOL/HDL RATIO: 5
CHOLESTEROL: 247 mg/dL — AB (ref 0–200)
HDL: 53 mg/dL (ref >39.00)
LDL CALC: 182 mg/dL — AB (ref 0–99)
TRIGLYCERIDES: 59 mg/dL (ref 0.0–149.0)
VLDL: 11.8 mg/dL (ref 0.0–40.0)

## 2014-02-09 LAB — TSH: TSH: 2.57 u[IU]/mL (ref 0.35–4.50)

## 2014-02-09 LAB — HEMOGLOBIN A1C: Hgb A1c MFr Bld: 6.1 % (ref 4.6–6.5)

## 2014-02-12 ENCOUNTER — Encounter: Payer: Self-pay | Admitting: Internal Medicine

## 2014-02-12 ENCOUNTER — Ambulatory Visit (INDEPENDENT_AMBULATORY_CARE_PROVIDER_SITE_OTHER): Payer: Medicare Other | Admitting: Internal Medicine

## 2014-02-12 VITALS — BP 122/70 | HR 68 | Temp 98.4°F | Ht 63.0 in | Wt 207.5 lb

## 2014-02-12 DIAGNOSIS — C549 Malignant neoplasm of corpus uteri, unspecified: Secondary | ICD-10-CM

## 2014-02-12 DIAGNOSIS — M549 Dorsalgia, unspecified: Secondary | ICD-10-CM

## 2014-02-12 DIAGNOSIS — R739 Hyperglycemia, unspecified: Secondary | ICD-10-CM

## 2014-02-12 DIAGNOSIS — E7800 Pure hypercholesterolemia, unspecified: Secondary | ICD-10-CM

## 2014-02-12 DIAGNOSIS — Z1239 Encounter for other screening for malignant neoplasm of breast: Secondary | ICD-10-CM

## 2014-02-12 DIAGNOSIS — G4730 Sleep apnea, unspecified: Secondary | ICD-10-CM

## 2014-02-12 DIAGNOSIS — E2839 Other primary ovarian failure: Secondary | ICD-10-CM

## 2014-02-12 DIAGNOSIS — R7309 Other abnormal glucose: Secondary | ICD-10-CM

## 2014-02-12 DIAGNOSIS — C541 Malignant neoplasm of endometrium: Secondary | ICD-10-CM

## 2014-02-12 DIAGNOSIS — D72829 Elevated white blood cell count, unspecified: Secondary | ICD-10-CM

## 2014-02-12 DIAGNOSIS — I10 Essential (primary) hypertension: Secondary | ICD-10-CM

## 2014-02-12 NOTE — Progress Notes (Signed)
Subjective:    Patient ID: Barbara Burns, female    DOB: 05-14-1946, 68 y.o.   MRN: [id number]  HPI 68 year old female with past history of hypertension, hyperglycemia and hypercholesterolemia who comes in today to follow up on these issues as well as for a complete physical exam.   She states she is doing relatively well.   No chest pain or tightness with increased activity or exertion.  Bowels stable.  Up to date with her GYN exams.  Due a mammogram.  Recently had muscle spasms in her back.  Went to [facility].  Was given a prednisone taper.  Better now.  Was on prednisone when labs drawn.  Blood pressures have been doing well.  Discussed elevated cholesterol.  She desires not to take statins.  We discussed treatment options.  She desires a trial of niacin.     Past Medical History  Diagnosis Date  . Hypertension   . Hypercholesterolemia   . Endometrial cancer     s/p hysterectomy - 2003  . GERD (gastroesophageal reflux disease)   . Chicken pox     Outpatient Encounter Prescriptions as of 02/12/2014  Medication Sig  . aspirin 81 MG tablet Take 81 mg by mouth daily.  . Calcium Carbonate (CALCIUM 600 PO) Take 1 tablet by mouth 2 (two) times daily.  . fexofenadine (ALLEGRA) 180 MG tablet Take 180 mg by mouth daily.  . fish oil-omega-3 fatty acids 1000 MG capsule Take 1 g by mouth daily.  . hydrochlorothiazide (MICROZIDE) 12.5 MG capsule Take 1 capsule (12.5 mg total) by mouth daily.  . irbesartan-hydrochlorothiazide (AVALIDE) 150-12.5 MG per tablet Take 1 tablet by mouth daily.  Marland Kitchen omeprazole (PRILOSEC) 20 MG capsule Take 20 mg by mouth daily.    Review of Systems Patient denies any headache, lightheadedness or dizziness.  No significant sinus or allergy symptoms.  No chest pain, tightness or palpitations.  No increased shortness of breath, cough or congestion.  No nausea or vomiting.  No increased acid reflux.   No abdominal pain or cramping.  No bowel change, such as diarrhea, constipation,  BRBPR or melana.  No urine change.  No vaginal problems.  Sees gyn for her pelvic exam given her history of endometrial cancer.  Discussed cholesterol as outlined.       Objective:   Physical Exam  Filed Vitals:   02/12/14 1526  BP: 122/70  Pulse: 68  Temp: 98.4 F (70.72 C)   68 year old female in no acute distress.   HEENT:  Nares- clear.  Oropharynx - without lesions. NECK:  Supple.  Nontender.  No audible bruit.  HEART:  Appears to be regular. I/VI systolic murmur.  LUNGS:  No crackles or wheezing audible.  Respirations even and unlabored.  RADIAL PULSE:  Equal bilaterally.    BREASTS:  Performed by gyn.  ABDOMEN:  Soft, nontender.  Bowel sounds present and normal.  No audible abdominal bruit.  GU: performed by gyn.  EXTREMITIES:  No increased edema present.  DP pulses palpable and equal bilaterally.           Assessment & Plan:  CARDIOVASCULAR.  Currently asymptomatic.  Continue risk factor modification.   GI.  Colonoscopy 09/04/10 revealed diverticulosis.  Upper symptoms controlled.  Follow.   INCREASED PSYCHOSOCIAL STRESSORS.  Doing well.  Follow.   HEALTH MAINTENANCE.  Physical today.  Pelvics and paps through GYN (Dr Delsa Sale).  Colonoscopy as outlined.  Mammogram 01/30/13 - BiRADS II.   Schedule mammogram.  I spent 25 minutes with the patient and more than 50% of the time was spent in consultation regarding the above.

## 2014-02-12 NOTE — Progress Notes (Signed)
Pre visit review using our clinic review tool, if applicable. No additional management support is needed unless otherwise documented below in the visit note. 

## 2014-02-18 ENCOUNTER — Encounter: Payer: Self-pay | Admitting: Internal Medicine

## 2014-02-20 ENCOUNTER — Encounter: Payer: Self-pay | Admitting: Internal Medicine

## 2014-02-20 DIAGNOSIS — D72829 Elevated white blood cell count, unspecified: Secondary | ICD-10-CM | POA: Insufficient documentation

## 2014-02-20 NOTE — Assessment & Plan Note (Signed)
Low cholesterol diet and exercise.  Follow lipid panel.  Cholesterol just checked.  LDL - 180s.  She declines statin - cholesterol medication.  Discussed treatment options.  She wants to try a trial of niacin.  Discussed titration.  Discussed possible flushing.

## 2014-02-20 NOTE — Assessment & Plan Note (Signed)
White count elevated.  Feel related to recent prednisone treatment.  Recheck cbc within the next 2-3 weeks.

## 2014-02-20 NOTE — Assessment & Plan Note (Signed)
Continue CPAP.  Follow.   

## 2014-02-20 NOTE — Assessment & Plan Note (Signed)
Followed by GYN - Dr Delsa Sale.  Up to date.

## 2014-02-20 NOTE — Assessment & Plan Note (Signed)
Blood pressure under good control.  Same meds.  Follow metabolic panel.   

## 2014-02-20 NOTE — Assessment & Plan Note (Signed)
Low carb diet.  Continue exercise.  Weight loss.  Follow met b and a1c.  Sees Dr Woodard yearly.    

## 2014-02-22 ENCOUNTER — Other Ambulatory Visit: Payer: Self-pay | Admitting: *Deleted

## 2014-02-22 ENCOUNTER — Encounter: Payer: Self-pay | Admitting: Internal Medicine

## 2014-02-24 NOTE — Addendum Note (Signed)
Addended by: Alisa Graff on: 02/24/2014 01:07 PM   Modules accepted: Orders

## 2014-02-27 ENCOUNTER — Emergency Department: Payer: Self-pay | Admitting: Emergency Medicine

## 2014-02-27 LAB — COMPREHENSIVE METABOLIC PANEL
ALK PHOS: 78 U/L
ANION GAP: 5 — AB (ref 7–16)
AST: 30 U/L (ref 15–37)
Albumin: 3.5 g/dL (ref 3.4–5.0)
BUN: 11 mg/dL (ref 7–18)
Bilirubin,Total: 0.8 mg/dL (ref 0.2–1.0)
CHLORIDE: 101 mmol/L (ref 98–107)
CREATININE: 0.79 mg/dL (ref 0.60–1.30)
Calcium, Total: 8.9 mg/dL (ref 8.5–10.1)
Co2: 30 mmol/L (ref 21–32)
EGFR (African American): >60
EGFR (Non-African Amer.): >60
Glucose: 103 mg/dL — ABNORMAL HIGH (ref 65–99)
Osmolality: 272 (ref 275–301)
Potassium: 3.4 mmol/L — ABNORMAL LOW (ref 3.5–5.1)
SGPT (ALT): 49 U/L (ref 12–78)
SODIUM: 136 mmol/L (ref 136–145)
TOTAL PROTEIN: 7.5 g/dL (ref 6.4–8.2)

## 2014-02-27 LAB — CBC
HCT: 41.2 % (ref 35.0–47.0)
HGB: 14 g/dL (ref 12.0–16.0)
MCH: 30 pg (ref 26.0–34.0)
MCHC: 34 g/dL (ref 32.0–36.0)
MCV: 88 fL (ref 80–100)
Platelet: 266 10*3/uL (ref 150–440)
RBC: 4.67 10*6/uL (ref 3.80–5.20)
RDW: 13 % (ref 11.5–14.5)
WBC: 7.9 10*3/uL (ref 3.6–11.0)

## 2014-02-27 LAB — URINALYSIS, COMPLETE
BILIRUBIN, UR: NEGATIVE
Blood: NEGATIVE
Glucose,UR: NEGATIVE mg/dL (ref 0–75)
Ketone: NEGATIVE
LEUKOCYTE ESTERASE: NEGATIVE
NITRITE: NEGATIVE
PROTEIN: NEGATIVE
Ph: 6 (ref 4.5–8.0)
Specific Gravity: 1.006 (ref 1.003–1.030)
Squamous Epithelial: 1

## 2014-02-27 LAB — LIPASE, BLOOD: Lipase: 104 U/L (ref 73–393)

## 2014-02-28 LAB — URINE CULTURE

## 2014-03-02 ENCOUNTER — Encounter: Payer: Self-pay | Admitting: Internal Medicine

## 2014-03-02 DIAGNOSIS — M549 Dorsalgia, unspecified: Secondary | ICD-10-CM

## 2014-03-02 NOTE — Telephone Encounter (Signed)
I spoke with patient & she notified me that she already has an appointment with [facility] on 03/18/14, but needs something sooner whether its with them or [facility] or even [facility] (last resort).

## 2014-03-02 NOTE — Telephone Encounter (Signed)
Pt has my chart but please call her and let her know that I have placed the order for the referral to ortho.  Hoyle Sauer should be contacting her soon with an appt.  Let me know if I still need to call her.  Thanks.

## 2014-03-05 ENCOUNTER — Other Ambulatory Visit: Payer: Medicare Other

## 2014-03-09 ENCOUNTER — Encounter: Payer: Self-pay | Admitting: Internal Medicine

## 2014-03-09 ENCOUNTER — Encounter: Payer: Self-pay | Admitting: Family Medicine

## 2014-03-09 ENCOUNTER — Other Ambulatory Visit (INDEPENDENT_AMBULATORY_CARE_PROVIDER_SITE_OTHER): Payer: Medicare Other

## 2014-03-09 ENCOUNTER — Ambulatory Visit (INDEPENDENT_AMBULATORY_CARE_PROVIDER_SITE_OTHER): Payer: Medicare Other | Admitting: Family Medicine

## 2014-03-09 VITALS — BP 124/82 | HR 77 | Ht 63.0 in | Wt 211.0 lb

## 2014-03-09 DIAGNOSIS — M62830 Muscle spasm of back: Secondary | ICD-10-CM

## 2014-03-09 DIAGNOSIS — M24559 Contracture, unspecified hip: Secondary | ICD-10-CM

## 2014-03-09 DIAGNOSIS — M5380 Other specified dorsopathies, site unspecified: Secondary | ICD-10-CM

## 2014-03-09 DIAGNOSIS — D72829 Elevated white blood cell count, unspecified: Secondary | ICD-10-CM

## 2014-03-09 DIAGNOSIS — M6240 Contracture of muscle, unspecified site: Secondary | ICD-10-CM

## 2014-03-09 DIAGNOSIS — M9981 Other biomechanical lesions of cervical region: Secondary | ICD-10-CM

## 2014-03-09 DIAGNOSIS — M999 Biomechanical lesion, unspecified: Secondary | ICD-10-CM

## 2014-03-09 LAB — CBC WITH DIFFERENTIAL/PLATELET
BASOS ABS: 0.1 10*3/uL (ref 0.0–0.1)
Basophils Relative: 1.1 % (ref 0.0–3.0)
EOS ABS: 0.2 10*3/uL (ref 0.0–0.7)
Eosinophils Relative: 2.7 % (ref 0.0–5.0)
HEMATOCRIT: 39.7 % (ref 36.0–46.0)
HEMOGLOBIN: 13.5 g/dL (ref 12.0–15.0)
LYMPHS ABS: 3.9 10*3/uL (ref 0.7–4.0)
Lymphocytes Relative: 43.5 % (ref 12.0–46.0)
MCHC: 34.1 g/dL (ref 30.0–36.0)
MCV: 88.1 fl (ref 78.0–100.0)
MONO ABS: 0.8 10*3/uL (ref 0.1–1.0)
Monocytes Relative: 9 % (ref 3.0–12.0)
Neutro Abs: 3.9 10*3/uL (ref 1.4–7.7)
Neutrophils Relative %: 43.7 % (ref 43.0–77.0)
Platelets: 328 10*3/uL (ref 150.0–400.0)
RBC: 4.51 Mil/uL (ref 3.87–5.11)
RDW: 13 % (ref 11.5–15.5)
WBC: 9 10*3/uL (ref 4.0–10.5)

## 2014-03-09 NOTE — Progress Notes (Signed)
[facility] [address], [place] [address] Phone: [phone] Subjective:    I'm seeing this patient by the request  of:  Barbara S, MD   CC: Thoracic and lumbar back pain  [id number] Barbara Burns is a 68 y.o. female coming in with complaint of thoracic and lumbar back pain. Patient recently moved out of her house and into a new house and had significant amount of lifting. This occurred approximately 3 weeks ago. Patient then had a spasm in her thoracic and lumbar spine. Patient states it is so severe that she did have to go to the emergency department. Patient has had x-rays and a CT scan that were reviewed by me today and do not show any significant bony abnormality or Burns signs of a herniated disc. Patient states though unfortunately that the pain has not resolved. Patient states will come and go in severity but has a constant dull aching pain at all times. Patient finds it extremely difficult to sleep at night secondary to the pain. Patient has even tried narcotics as well as muscle relaxers to help with minimal benefit. Patient was in severity as 8/10 because it is affecting her daily activities. Denies any radiation down the legs or any numbness or weakness. Denies any abnormal weight loss or fevers or chills.     Past medical history, social, surgical and family history all reviewed in electronic medical record.   Review of Systems: Burns headache, visual changes, nausea, vomiting, diarrhea, constipation, dizziness, abdominal pain, skin rash, fevers, chills, night sweats, weight loss, swollen lymph nodes, body aches, joint swelling, muscle aches, chest pain, shortness of breath, mood changes.   Objective Blood pressure 124/82, pulse 77, height 5\' 3"  (1.6 m), weight 211 lb (95.709 kg), SpO2 97.00%.  General: Burns apparent distress alert and oriented x3 mood and affect normal, dressed appropriately.  obese HEENT: Pupils equal, extraocular  movements intact  Respiratory: Patient'Burns speak in full sentences and does not appear short of breath  Cardiovascular: Burns lower extremity edema, non tender, Burns erythema  Skin: Warm dry intact with Burns signs of infection or rash on extremities or on axial skeleton.  Abdomen: Soft nontender  Neuro: Cranial nerves II through XII are intact, neurovascularly intact in all extremities with 2+ DTRs and 2+ pulses.  Lymph: Burns lymphadenopathy of posterior or anterior cervical chain or axillae bilaterally.  Gait normal with good balance and coordination.  MSK:  Non tender with full range of motion and good stability and symmetric strength and tone of shoulders, elbows, wrist, hip, knee and ankles bilaterally.  Back Exam:  Inspection: Unremarkable  Motion: Flexion 35 deg, Extension 35 deg, Side Bending to 25 deg bilaterally,  Rotation to 35 deg bilaterally  SLR laying: Negative  XSLR laying: Negative  Palpable tenderness: Tender to palpation on the left side of the paraspinal musculatures from T10-L2.Marland Kitchen FABER: negative. Sensory change: Gross sensation intact to all lumbar and sacral dermatomes.  Reflexes: 2+ at both patellar tendons, 2+ at achilles tendons, Babinski'Burns downgoing.  Strength at foot  Plantar-flexion: 5/5 Dorsi-flexion: 5/5 Eversion: 5/5 Inversion: 5/5  Leg strength  Quad: 5/5 Hamstring: 5/5 Hip flexor: 4/5 Hip abductors: 4/5  Gait unremarkable. 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   Lumbar L2 F RS left  Sacrum Right  on right         Impression and Recommendations:     This case required medical decision making of moderate complexity.

## 2014-03-09 NOTE — Assessment & Plan Note (Signed)
Decision today to treat with OMT was based on Physical Exam  After verbal consent patient was treated with HVLA, muscle energy techniques in cervical, thoracic, rib, lumbar areas  Patient tolerated the procedure well with improvement in symptoms  Patient given exercises, stretches and lifestyle modifications  See medications in patient instructions if given  Patient will follow up in 2 weeks

## 2014-03-09 NOTE — Patient Instructions (Signed)
Good to see you Ice 20 minutes 2 times a day for the next week.  Vitamin D 2000 IU daily Turmeric 500mg  twice daily.  Work on hip flexor stretches 3 times a week.  Tramadol at night if needed Come back again in 2 weeks.

## 2014-03-09 NOTE — Assessment & Plan Note (Signed)
Patient actually does have a very strong thoracic spine but does not have significant musculature of the lumbar spine and does have tight hip flexors. We're going to work on his muscle imbalance by stretching the hip flexors. Patient was given a handout today. We discussed icing protocol. Patient will follow up in 2 weeks for further evaluation.

## 2014-03-09 NOTE — Assessment & Plan Note (Signed)
Patient does have muscle spasm due to a inhaled 10th rib that it didn't happen while patient was doing repetitive motion was removed. Patient did respond very well to osteopathic manipulation near complete resolution of pain. We discussed home exercises and was given a sheet to work on stretching the hip flexors as well. We discussed over-the-counter medications can be beneficial. Patient will follow up again in 2 weeks for further evaluation and manipulation.

## 2014-03-23 ENCOUNTER — Encounter: Payer: Self-pay | Admitting: Family Medicine

## 2014-03-23 ENCOUNTER — Ambulatory Visit (INDEPENDENT_AMBULATORY_CARE_PROVIDER_SITE_OTHER): Payer: Medicare Other | Admitting: Family Medicine

## 2014-03-23 VITALS — BP 120/80 | HR 81 | Ht 63.0 in | Wt 207.0 lb

## 2014-03-23 DIAGNOSIS — M5380 Other specified dorsopathies, site unspecified: Secondary | ICD-10-CM

## 2014-03-23 DIAGNOSIS — M62830 Muscle spasm of back: Secondary | ICD-10-CM

## 2014-03-23 DIAGNOSIS — M999 Biomechanical lesion, unspecified: Secondary | ICD-10-CM

## 2014-03-23 DIAGNOSIS — M9981 Other biomechanical lesions of cervical region: Secondary | ICD-10-CM

## 2014-03-23 NOTE — Assessment & Plan Note (Signed)
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 Patient overall continues to respond extremely well to osteopathic manipulation. Patient given new postural exercises I can be beneficial we discussed about sitting position the to be helpful as well. Patient will continue with the over-the-counter medications as well as the exercises. Patient will follow up again in 3-4 weeks.  Spent greater than 25 minutes with patient face-to-face and had greater than 50% of counseling including as described above in assessment and plan.

## 2014-03-23 NOTE — Progress Notes (Signed)
  [name] [address], [place] [address] Phone: [phone] Subjective:     CC: Thoracic and lumbar back pain  Follow up.   [id number] Barbara Burns is a 68 y.o. female coming in for followup of her thoracic pain. Patient did have osteopathic manipulation done as well as given home exercise program as well as over-the-counter medications. Patient states that she is approximately 60% better. Patient states over the course last 2 days it started to come back slowly. Patient denies any new symptoms. Patient denies any new symptoms. Patient was found to have a slipped rib previously and states that this seems to be the location where the pain is starting to come back again.     Past medical history, social, surgical and family history all reviewed in electronic medical record.   Review of Systems: No headache, visual changes, nausea, vomiting, diarrhea, constipation, dizziness, abdominal pain, skin rash, fevers, chills, night sweats, weight loss, swollen lymph nodes, body aches, joint swelling, muscle aches, chest pain, shortness of breath, mood changes.   Objective Blood pressure 120/80, pulse 81, height 5\' 3"  (1.6 m), weight 207 lb (93.895 kg), SpO2 95.00%.  General: No apparent distress alert and oriented x3 mood and affect normal, dressed appropriately.  obese HEENT: Pupils equal, extraocular movements intact  Respiratory: Patient's speak in full sentences and does not appear short of breath  Cardiovascular: No lower extremity edema, non tender, no erythema  Skin: Warm dry intact with no signs of infection or rash on extremities or on axial skeleton.  Abdomen: Soft nontender  Neuro: Cranial nerves II through XII are intact, neurovascularly intact in all extremities with 2+ DTRs and 2+ pulses.  Lymph: No lymphadenopathy of posterior or anterior cervical chain or axillae bilaterally.  Gait normal with good balance and coordination.  MSK:  Non  tender with full range of motion and good stability and symmetric strength and tone of shoulders, elbows, wrist, hip, knee and ankles bilaterally.  Back Exam:  Inspection: Unremarkable  Motion: Flexion 35 deg, Extension 35 deg, Side Bending to 25 deg bilaterally,  Rotation to 35 deg bilaterally  SLR laying: Negative  XSLR laying: Negative  Palpable tenderness: Tender to palpation on the left side of the paraspinal musculatures from T10-L2.Marland Kitchen FABER: negative. Sensory change: Gross sensation intact to all lumbar and sacral dermatomes.  Reflexes: 2+ at both patellar tendons, 2+ at achilles tendons, Babinski's downgoing.  Strength at foot  Plantar-flexion: 5/5 Dorsi-flexion: 5/5 Eversion: 5/5 Inversion: 5/5  Leg strength  Quad: 5/5 Hamstring: 5/5 Hip flexor: 4/5 Hip abductors: 4/5  Gait unremarkable. Severe hip flexor tightness bilaterally  OMT Physical Exam   Standing flexion right  Seated Flexion right  Cervical  C2 F RS right C4 F RS left C6 flexed rotated and side bent right  Thoracic T3 extended rotated and side bent right T10 E RS left with inhaled 10th rib.   Lumbar L2 F RS left  Sacrum Right on right    Impression and Recommendations:     This case required medical decision making of moderate complexity.

## 2014-03-23 NOTE — Patient Instructions (Signed)
Good to see you Exercises are doing well at least 3 times daily.  Posture on wall stand with heel, butt shoulder and head touching for goal of 5 minutes daily.  Consider with sitting a long time, tennis ball duct tape to chair between shoulder blades.  Lets try 3 weeks.

## 2014-03-23 NOTE — Assessment & Plan Note (Signed)
Decision today to treat with OMT was based on Physical Exam  After verbal consent patient was treated with HVLA, muscle energy techniques in cervical, thoracic, rib, lumbar areas  Patient tolerated the procedure well with improvement in symptoms  Patient given exercises, stretches and lifestyle modifications  See medications in patient instructions if given  Patient will follow up in 3-4 weeks

## 2014-04-13 ENCOUNTER — Ambulatory Visit (INDEPENDENT_AMBULATORY_CARE_PROVIDER_SITE_OTHER): Payer: Medicare Other | Admitting: Family Medicine

## 2014-04-13 ENCOUNTER — Encounter: Payer: Self-pay | Admitting: Family Medicine

## 2014-04-13 VITALS — BP 130/82 | HR 72 | Ht 63.0 in | Wt 210.0 lb

## 2014-04-13 DIAGNOSIS — M62830 Muscle spasm of back: Secondary | ICD-10-CM

## 2014-04-13 DIAGNOSIS — M5380 Other specified dorsopathies, site unspecified: Secondary | ICD-10-CM

## 2014-04-13 DIAGNOSIS — M999 Biomechanical lesion, unspecified: Secondary | ICD-10-CM

## 2014-04-13 DIAGNOSIS — M9981 Other biomechanical lesions of cervical region: Secondary | ICD-10-CM

## 2014-04-13 NOTE — Assessment & Plan Note (Signed)
Decision today to treat with OMT was based on Physical Exam  After verbal consent patient was treated with HVLA, muscle energy techniques in cervical, thoracic, rib, lumbar areas  Patient tolerated the procedure well with improvement in symptoms  Patient given exercises, stretches and lifestyle modifications  See medications in patient instructions if given  Patient will follow up in 6 weeks

## 2014-04-13 NOTE — Progress Notes (Signed)
  [facility] [address], [place] [address] Phone: [phone] Subjective:     CC: Thoracic and lumbar back pain  Follow up.   [id number] Barbara Burns is a 68 y.o. female coming in for followup of her thoracic pain. Patient overall has been doing relatively well. Patient states that she does have some mild discomfort. Patient has been doing exercises well and taking medications and feels like a new woman. Patient has no new complaints. She is noticing that the rib on the left side is starting to hurt her again a little bit.     Past medical history, social, surgical and family history all reviewed in electronic medical record.   Review of Systems: No headache, visual changes, nausea, vomiting, diarrhea, constipation, dizziness, abdominal pain, skin rash, fevers, chills, night sweats, weight loss, swollen lymph nodes, body aches, joint swelling, muscle aches, chest pain, shortness of breath, mood changes.   Objective Blood pressure 130/82, pulse 72, height 5\' 3"  (1.6 m), weight 210 lb (95.255 kg), SpO2 94.00%.  General: No apparent distress alert and oriented x3 mood and affect normal, dressed appropriately.  obese HEENT: Pupils equal, extraocular movements intact  Respiratory: Patient's speak in full sentences and does not appear short of breath  Cardiovascular: No lower extremity edema, non tender, no erythema  Skin: Warm dry intact with no signs of infection or rash on extremities or on axial skeleton.  Abdomen: Soft nontender  Neuro: Cranial nerves II through XII are intact, neurovascularly intact in all extremities with 2+ DTRs and 2+ pulses.  Lymph: No lymphadenopathy of posterior or anterior cervical chain or axillae bilaterally.  Gait normal with good balance and coordination.  MSK:  Non tender with full range of motion and good stability and symmetric strength and tone of shoulders, elbows, wrist, hip, knee and ankles bilaterally.    Back Exam:  Inspection: Unremarkable  Motion: Flexion 35 deg, Extension 35 deg, Side Bending to 25 deg bilaterally,  Rotation to 35 deg bilaterally  SLR laying: Negative  XSLR laying: Negative  Palpable tenderness: Tender to palpation on the left side of the paraspinal musculatures from T10-L2.Marland Kitchen FABER: negative. Sensory change: Gross sensation intact to all lumbar and sacral dermatomes.  Reflexes: 2+ at both patellar tendons, 2+ at achilles tendons, Babinski's downgoing.  Strength at foot  Plantar-flexion: 5/5 Dorsi-flexion: 5/5 Eversion: 5/5 Inversion: 5/5  Leg strength  Quad: 5/5 Hamstring: 5/5 Hip flexor: 4/5 Hip abductors: 4/5  Gait unremarkable. Severe hip flexor tightness bilaterally  OMT Physical Exam   Cervical  C2 F RS right C4 F RS left  Thoracic T3 extended rotated and side bent right T10 E RS left with inhaled 10th rib.   Lumbar L2 F RS left  Sacrum Right on right    Impression and Recommendations:     This case required medical decision making of moderate complexity.

## 2014-04-13 NOTE — Patient Instructions (Signed)
Good to see you as always.  Continue what you are doing.  Ice is your friend Exercises 3 times a week.  See you in 6 weeks.

## 2014-04-13 NOTE — Assessment & Plan Note (Signed)
Patient is doing very well with conservative therapy and osteopathic manipulation. At this time I think patient will continue home exercises as well as the natural supplementations. Patient will continue to work on postural exercises and gave her face to strengthening exercises today. Patient will elongate Hirsch visits and we'll do 6 week intervals.  Spent greater than 25 minutes with patient face-to-face and had greater than 50% of counseling including as described above in assessment and plan.

## 2014-05-03 ENCOUNTER — Encounter: Payer: Self-pay | Admitting: *Deleted

## 2014-05-03 ENCOUNTER — Ambulatory Visit: Payer: Self-pay | Admitting: Internal Medicine

## 2014-05-03 LAB — HM MAMMOGRAPHY: HM Mammogram: NEGATIVE

## 2014-05-27 ENCOUNTER — Encounter: Payer: Self-pay | Admitting: Family Medicine

## 2014-05-27 ENCOUNTER — Ambulatory Visit (INDEPENDENT_AMBULATORY_CARE_PROVIDER_SITE_OTHER): Payer: Medicare Other | Admitting: Family Medicine

## 2014-05-27 VITALS — BP 112/78 | HR 70 | Ht 63.0 in | Wt 210.0 lb

## 2014-05-27 DIAGNOSIS — M9981 Other biomechanical lesions of cervical region: Secondary | ICD-10-CM

## 2014-05-27 DIAGNOSIS — M5380 Other specified dorsopathies, site unspecified: Secondary | ICD-10-CM

## 2014-05-27 DIAGNOSIS — M999 Biomechanical lesion, unspecified: Secondary | ICD-10-CM

## 2014-05-27 DIAGNOSIS — M62830 Muscle spasm of back: Secondary | ICD-10-CM

## 2014-05-27 NOTE — Patient Instructions (Signed)
Good to see you You are doing  CoQ10 can help with the red yeast rice.  New neck exercises  I will keep an ear out.  Come back when you need me.

## 2014-05-27 NOTE — Progress Notes (Signed)
  [name] [address] [place], [place] [address] Phone: [phone] Subjective:     CC: Thoracic and lumbar back pain  Follow up.   [id number] Barbara Burns is a 68 y.o. female coming in for followup of her thoracic pain. Patient overall has been doing relatively well. States that she's been doing really well. Patient unfortunately has been out of work for the last 2 months and has not been working as much but this is giving her more time the dorsal exercises on a regular basis. Denies any new symptoms denies any radiation of symptoms denies any nighttime awakening.     Past medical history, social, surgical and family history all reviewed in electronic medical record.   Review of Systems: No headache, visual changes, nausea, vomiting, diarrhea, constipation, dizziness, abdominal pain, skin rash, fevers, chills, night sweats, weight loss, swollen lymph nodes, body aches, joint swelling, muscle aches, chest pain, shortness of breath, mood changes.   Objective Blood pressure 112/78, pulse 70, height 5\' 3"  (1.6 m), weight 210 lb (95.255 kg), SpO2 96.00%.  General: No apparent distress alert and oriented x3 mood and affect normal, dressed appropriately.  obese HEENT: Pupils equal, extraocular movements intact  Respiratory: Patient's speak in full sentences and does not appear short of breath  Cardiovascular: No lower extremity edema, non tender, no erythema  Skin: Warm dry intact with no signs of infection or rash on extremities or on axial skeleton.  Abdomen: Soft nontender  Neuro: Cranial nerves II through XII are intact, neurovascularly intact in all extremities with 2+ DTRs and 2+ pulses.  Lymph: No lymphadenopathy of posterior or anterior cervical chain or axillae bilaterally.  Gait normal with good balance and coordination.  MSK:  Non tender with full range of motion and good stability and symmetric strength and tone of shoulders, elbows,  wrist, hip, knee and ankles bilaterally.  Back Exam:  Inspection: Unremarkable  Motion: Flexion 35 deg, Extension 35 deg, Side Bending to 35 deg bilaterally,  Rotation to 35 deg bilaterally  SLR laying: Negative  XSLR laying: Negative  Palpable tenderness: Tender to palpation on the left side of the paraspinal musculatures from T10-L2.Marland Kitchen FABER: negative. Sensory change: Gross sensation intact to all lumbar and sacral dermatomes.  Reflexes: 2+ at both patellar tendons, 2+ at achilles tendons, Babinski's downgoing.  Strength at foot  Plantar-flexion: 5/5 Dorsi-flexion: 5/5 Eversion: 5/5 Inversion: 5/5  Leg strength  Quad: 5/5 Hamstring: 5/5 Hip flexor: 4/5 Hip abductors: 4/5  Gait unremarkable. Severe hip flexor tightness bilaterally  OMT Physical Exam   Cervical  C2 F RS right C4 F RS left  Thoracic T3 extended rotated and side bent right T8 E RS left   Lumbar L2 F RS left  Sacrum Right on right    Impression and Recommendations:     This case required medical decision making of moderate complexity.

## 2014-05-27 NOTE — Assessment & Plan Note (Signed)
Decision today to treat with OMT was based on Physical Exam  After verbal consent patient was treated with HVLA, muscle energy techniques in cervical, thoracic, rib, lumbar areas  Patient tolerated the procedure well with improvement in symptoms  Patient given exercises, stretches and lifestyle modifications  See medications in patient instructions if given  Patient will follow up when necessary

## 2014-05-27 NOTE — Assessment & Plan Note (Signed)
Patient is doing remarkably well at this time. Patient is not having any pain it is stopping her from any of her activities. Patient is not taking any pain medications. Patient is continuing to over-the-counter nutritional supplementations that I think would be helpful. Discuss continuing the icing and home exercises at least 2 times a week. Patient is doing well enough that she can follow up with me on an as-needed basis.

## 2014-06-19 ENCOUNTER — Ambulatory Visit (INDEPENDENT_AMBULATORY_CARE_PROVIDER_SITE_OTHER): Payer: Medicare Other

## 2014-06-19 DIAGNOSIS — Z23 Encounter for immunization: Secondary | ICD-10-CM

## 2014-06-21 ENCOUNTER — Telehealth: Payer: Self-pay | Admitting: *Deleted

## 2014-06-21 DIAGNOSIS — I10 Essential (primary) hypertension: Secondary | ICD-10-CM

## 2014-06-21 DIAGNOSIS — D72829 Elevated white blood cell count, unspecified: Secondary | ICD-10-CM

## 2014-06-21 DIAGNOSIS — C541 Malignant neoplasm of endometrium: Secondary | ICD-10-CM

## 2014-06-21 DIAGNOSIS — R739 Hyperglycemia, unspecified: Secondary | ICD-10-CM

## 2014-06-21 DIAGNOSIS — E7800 Pure hypercholesterolemia, unspecified: Secondary | ICD-10-CM

## 2014-06-21 NOTE — Telephone Encounter (Signed)
Pt is coming in tomorrow what labs and dx?  

## 2014-06-21 NOTE — Telephone Encounter (Signed)
Order placed for labs.

## 2014-06-22 ENCOUNTER — Other Ambulatory Visit (INDEPENDENT_AMBULATORY_CARE_PROVIDER_SITE_OTHER): Payer: Medicare Other

## 2014-06-22 DIAGNOSIS — R7309 Other abnormal glucose: Secondary | ICD-10-CM

## 2014-06-22 DIAGNOSIS — R739 Hyperglycemia, unspecified: Secondary | ICD-10-CM

## 2014-06-22 DIAGNOSIS — D72829 Elevated white blood cell count, unspecified: Secondary | ICD-10-CM

## 2014-06-22 DIAGNOSIS — I10 Essential (primary) hypertension: Secondary | ICD-10-CM

## 2014-06-22 DIAGNOSIS — E7800 Pure hypercholesterolemia, unspecified: Secondary | ICD-10-CM

## 2014-06-22 LAB — LIPID PANEL
CHOL/HDL RATIO: 6
Cholesterol: 226 mg/dL — ABNORMAL HIGH (ref 0–200)
HDL: 40.8 mg/dL (ref >39.00)
LDL Cholesterol: 164 mg/dL — ABNORMAL HIGH (ref 0–99)
NonHDL: 185.2
Triglycerides: 108 mg/dL (ref 0.0–149.0)
VLDL: 21.6 mg/dL (ref 0.0–40.0)

## 2014-06-22 LAB — HEPATIC FUNCTION PANEL
ALBUMIN: 3.9 g/dL (ref 3.5–5.2)
ALT: 45 U/L — ABNORMAL HIGH (ref 0–35)
AST: 28 U/L (ref 0–37)
Alkaline Phosphatase: 65 U/L (ref 39–117)
Bilirubin, Direct: 0.1 mg/dL (ref 0.0–0.3)
Total Bilirubin: 1.3 mg/dL — ABNORMAL HIGH (ref 0.2–1.2)
Total Protein: 7.3 g/dL (ref 6.0–8.3)

## 2014-06-22 LAB — BASIC METABOLIC PANEL
BUN: 14 mg/dL (ref 6–23)
CO2: 28 mEq/L (ref 19–32)
Calcium: 9.4 mg/dL (ref 8.4–10.5)
Chloride: 103 mEq/L (ref 96–112)
Creatinine, Ser: 0.9 mg/dL (ref 0.4–1.2)
GFR: 65.54 mL/min (ref >60.00)
Glucose, Bld: 127 mg/dL — ABNORMAL HIGH (ref 70–99)
Potassium: 4.1 mEq/L (ref 3.5–5.1)
Sodium: 139 mEq/L (ref 135–145)

## 2014-06-22 LAB — CBC WITH DIFFERENTIAL/PLATELET
Basophils Absolute: 0 10*3/uL (ref 0.0–0.1)
Basophils Relative: 0.6 % (ref 0.0–3.0)
Eosinophils Absolute: 0.2 10*3/uL (ref 0.0–0.7)
Eosinophils Relative: 2.7 % (ref 0.0–5.0)
HCT: 41.2 % (ref 36.0–46.0)
Hemoglobin: 14.1 g/dL (ref 12.0–15.0)
Lymphocytes Relative: 42.2 % (ref 12.0–46.0)
Lymphs Abs: 3.4 10*3/uL (ref 0.7–4.0)
MCHC: 34.3 g/dL (ref 30.0–36.0)
MCV: 87.8 fl (ref 78.0–100.0)
Monocytes Absolute: 0.6 10*3/uL (ref 0.1–1.0)
Monocytes Relative: 7.2 % (ref 3.0–12.0)
Neutro Abs: 3.8 10*3/uL (ref 1.4–7.7)
Neutrophils Relative %: 47.3 % (ref 43.0–77.0)
Platelets: 287 10*3/uL (ref 150.0–400.0)
RBC: 4.69 Mil/uL (ref 3.87–5.11)
RDW: 13.2 % (ref 11.5–15.5)
WBC: 8 10*3/uL (ref 4.0–10.5)

## 2014-06-22 LAB — HEMOGLOBIN A1C: Hgb A1c MFr Bld: 6.2 % (ref 4.6–6.5)

## 2014-06-23 ENCOUNTER — Encounter: Payer: Self-pay | Admitting: Internal Medicine

## 2014-06-24 ENCOUNTER — Ambulatory Visit (INDEPENDENT_AMBULATORY_CARE_PROVIDER_SITE_OTHER): Payer: Medicare Other | Admitting: Internal Medicine

## 2014-06-24 ENCOUNTER — Encounter: Payer: Self-pay | Admitting: Internal Medicine

## 2014-06-24 VITALS — BP 110/70 | HR 66 | Temp 98.1°F | Ht 63.0 in | Wt 210.8 lb

## 2014-06-24 DIAGNOSIS — D72829 Elevated white blood cell count, unspecified: Secondary | ICD-10-CM

## 2014-06-24 DIAGNOSIS — I10 Essential (primary) hypertension: Secondary | ICD-10-CM

## 2014-06-24 DIAGNOSIS — R739 Hyperglycemia, unspecified: Secondary | ICD-10-CM

## 2014-06-24 DIAGNOSIS — R945 Abnormal results of liver function studies: Secondary | ICD-10-CM

## 2014-06-24 DIAGNOSIS — E2839 Other primary ovarian failure: Secondary | ICD-10-CM

## 2014-06-24 DIAGNOSIS — G4730 Sleep apnea, unspecified: Secondary | ICD-10-CM

## 2014-06-24 DIAGNOSIS — E7800 Pure hypercholesterolemia, unspecified: Secondary | ICD-10-CM

## 2014-06-24 DIAGNOSIS — R7989 Other specified abnormal findings of blood chemistry: Secondary | ICD-10-CM

## 2014-06-24 DIAGNOSIS — C541 Malignant neoplasm of endometrium: Secondary | ICD-10-CM

## 2014-06-24 MED ORDER — HYDROCHLOROTHIAZIDE 12.5 MG PO CAPS: 12.5000 mg | ORAL_CAPSULE | Freq: Every day | ORAL | Status: DC

## 2014-06-24 MED ORDER — IRBESARTAN-HYDROCHLOROTHIAZIDE 150-12.5 MG PO TABS: 1.0000 | ORAL_TABLET | Freq: Every day | ORAL | Status: DC

## 2014-06-24 NOTE — Progress Notes (Signed)
Pre visit review using our clinic review tool, if applicable. No additional management support is needed unless otherwise documented below in the visit note. 

## 2014-06-24 NOTE — Progress Notes (Signed)
Subjective:    Patient ID: Barbara Burns, female    DOB: 1946-02-02, 68 y.o.   MRN: [id number]  HPI 68 year old female with past history of hypertension, hyperglycemia and hypercholesterolemia who comes in today for a scheduled follow up.  She states she is doing relatively well.   No chest pain or tightness with increased activity or exertion.  Bowels stable.  Due to follow up with gyn.  They follow her pelvics, etc.  Blood pressures have been doing well.  Discussed elevated cholesterol.  She desires not to take statins.  We discussed treatment options.  She plans to start exercising more and watching her diet.  Some increased stress with trying to find a job.  Feels she is coping well with this.  Does not need any further intervention.     Past Medical History  Diagnosis Date  . Hypertension   . Hypercholesterolemia   . Endometrial cancer     s/p hysterectomy - 2003  . GERD (gastroesophageal reflux disease)   . Chicken pox     Outpatient Encounter Prescriptions as of 06/24/2014  Medication Sig  . aspirin 81 MG tablet Take 81 mg by mouth daily.  . Calcium Carbonate (CALCIUM 600 PO) Take 1 tablet by mouth 2 (two) times daily.  . Cholecalciferol (VITAMIN D3) 2000 UNITS TABS Take 2,000 Units by mouth daily.  . fexofenadine (ALLEGRA) 180 MG tablet Take 180 mg by mouth daily.  . fish oil-omega-3 fatty acids 1000 MG capsule Take 1 g by mouth daily.  . hydrochlorothiazide (MICROZIDE) 12.5 MG capsule Take 1 capsule (12.5 mg total) by mouth daily.  . irbesartan-hydrochlorothiazide (AVALIDE) 150-12.5 MG per tablet Take 1 tablet by mouth daily.  Marland Kitchen omeprazole (PRILOSEC) 20 MG capsule Take 20 mg by mouth daily.  . Red Yeast Rice 600 MG TABS Take 600 mg by mouth 2 (two) times daily.  . Turmeric 500 MG CAPS Take 500 mg by mouth 2 (two) times daily.  . [DISCONTINUED] Cholecalciferol (VITAMIN D) 2000 UNITS tablet Take 2,000 Units by mouth daily.  . [DISCONTINUED] hydrochlorothiazide (MICROZIDE)  12.5 MG capsule Take 1 capsule (12.5 mg total) by mouth daily.  . [DISCONTINUED] irbesartan-hydrochlorothiazide (AVALIDE) 150-12.5 MG per tablet Take 1 tablet by mouth daily.  . [DISCONTINUED] OVER THE COUNTER MEDICATION Tumeric 1 tablet twice daily.    Review of Systems Patient denies any headache, lightheadedness or dizziness.  No significant sinus or allergy symptoms.  No chest pain, tightness or palpitations.  No increased shortness of breath, cough or congestion.  No nausea or vomiting.  No increased acid reflux.   No abdominal pain or cramping.  No bowel change, such as diarrhea, constipation, BRBPR or melana.  No urine change.  No vaginal problems.  Sees gyn for her pelvic exam given her history of endometrial cancer.  Discussed cholesterol as outlined.  Blood pressures averaging 110-130s/70s.      Objective:   Physical Exam  Filed Vitals:   06/27/14 0958  BP: 110/70  Pulse: 66  Temp: 98.1 F (36.7 C)   Blood pressure recheck:  11/37  68 year old female in no acute distress.   HEENT:  Nares- clear.  Oropharynx - without lesions. NECK:  Supple.  Nontender.  No audible bruit.  HEART:  Appears to be regular. LUNGS:  No crackles or wheezing audible.  Respirations even and unlabored.  RADIAL PULSE:  Equal bilaterally.    BREASTS:  No nipple discharge or nipple retraction present.  Could not appreciate any  distinct nodules or axillary adenopathy.  ABDOMEN:  Soft, nontender.  Bowel sounds present and normal.  No audible abdominal bruit.  GU:  Performed by gyn.    EXTREMITIES:  No increased edema present.  DP pulses palpable and equal bilaterally.      FEET:  No lesions.       Assessment & Plan:  CARDIOVASCULAR.  Currently asymptomatic.  Continue risk factor modification.   GI.  Colonoscopy 09/04/10 revealed diverticulosis.  Upper symptoms controlled.  Follow.   HEALTH MAINTENANCE.  Physical today.  Pelvics and paps through GYN (Dr Delsa Sale).  Colonoscopy as outlined.   Mammogram 05/03/14 - BiRADS I.     I spent 25 minutes with the patient and more than 50% of the time was spent in consultation regarding the above.

## 2014-06-27 ENCOUNTER — Encounter: Payer: Self-pay | Admitting: Internal Medicine

## 2014-06-27 DIAGNOSIS — R7989 Other specified abnormal findings of blood chemistry: Secondary | ICD-10-CM | POA: Insufficient documentation

## 2014-06-27 DIAGNOSIS — R945 Abnormal results of liver function studies: Secondary | ICD-10-CM | POA: Insufficient documentation

## 2014-06-27 NOTE — Assessment & Plan Note (Addendum)
Followed by GYN - Dr Delsa Sale.  Up to date.  Schedule a bone density.

## 2014-06-27 NOTE — Assessment & Plan Note (Signed)
White count previously elevated.  Feel related to recent prednisone treatment.  Recent cbc revealed normal white blood cell count.

## 2014-06-27 NOTE — Assessment & Plan Note (Signed)
Recent ALT elevated with slightly increased bilirubin.  Direct bilirubin wnl.  Recheck liver panel in a few weeks.  Discussed diet and exercise.  Will follow.

## 2014-06-27 NOTE — Assessment & Plan Note (Signed)
Blood pressure under good control.  Same meds.  Follow metabolic panel.   

## 2014-06-27 NOTE — Assessment & Plan Note (Signed)
Low cholesterol diet and exercise.  Follow lipid panel.  Cholesterol just checked.  LDL - 160s.  She declines statin - cholesterol medication.  She plans to get more serious about her diet and exercise.  Follow.

## 2014-06-27 NOTE — Assessment & Plan Note (Signed)
Low carb diet.  Continue exercise.  Weight loss.  Follow met b and a1c.  Sees Dr Ellin Mayhew yearly.

## 2014-06-27 NOTE — Assessment & Plan Note (Signed)
Continue CPAP.  Follow.   

## 2014-07-14 ENCOUNTER — Telehealth: Payer: Self-pay | Admitting: Internal Medicine

## 2014-07-14 ENCOUNTER — Encounter: Payer: Self-pay | Admitting: *Deleted

## 2014-07-14 ENCOUNTER — Ambulatory Visit: Payer: Self-pay | Admitting: Internal Medicine

## 2014-07-14 LAB — HM DEXA SCAN: HM DEXA SCAN: NORMAL

## 2014-07-14 NOTE — Telephone Encounter (Signed)
Pt notified bone density normal via my chart.

## 2014-07-15 ENCOUNTER — Other Ambulatory Visit (INDEPENDENT_AMBULATORY_CARE_PROVIDER_SITE_OTHER): Payer: Medicare Other

## 2014-07-15 ENCOUNTER — Encounter: Payer: Self-pay | Admitting: Internal Medicine

## 2014-07-15 ENCOUNTER — Ambulatory Visit (INDEPENDENT_AMBULATORY_CARE_PROVIDER_SITE_OTHER): Payer: Medicare Other | Admitting: *Deleted

## 2014-07-15 DIAGNOSIS — R7989 Other specified abnormal findings of blood chemistry: Secondary | ICD-10-CM

## 2014-07-15 DIAGNOSIS — R945 Abnormal results of liver function studies: Secondary | ICD-10-CM

## 2014-07-15 DIAGNOSIS — Z23 Encounter for immunization: Secondary | ICD-10-CM

## 2014-07-15 LAB — HEPATIC FUNCTION PANEL
ALBUMIN: 3.3 g/dL — AB (ref 3.5–5.2)
ALT: 36 U/L — ABNORMAL HIGH (ref 0–35)
AST: 25 U/L (ref 0–37)
Alkaline Phosphatase: 68 U/L (ref 39–117)
Bilirubin, Direct: 0.1 mg/dL (ref 0.0–0.3)
Total Bilirubin: 0.9 mg/dL (ref 0.2–1.2)
Total Protein: 7.1 g/dL (ref 6.0–8.3)

## 2014-07-19 NOTE — Telephone Encounter (Signed)
Unread mychart message mailed to patient 

## 2014-09-09 ENCOUNTER — Telehealth: Payer: Self-pay | Admitting: Internal Medicine

## 2014-09-09 NOTE — Telephone Encounter (Signed)
The patient wants to know if she is needing labs before she is seen on 2.1.16.

## 2014-09-12 ENCOUNTER — Other Ambulatory Visit: Payer: Self-pay | Admitting: Internal Medicine

## 2014-09-12 DIAGNOSIS — E7800 Pure hypercholesterolemia, unspecified: Secondary | ICD-10-CM

## 2014-09-12 DIAGNOSIS — R7989 Other specified abnormal findings of blood chemistry: Secondary | ICD-10-CM

## 2014-09-12 DIAGNOSIS — R945 Abnormal results of liver function studies: Secondary | ICD-10-CM

## 2014-09-12 DIAGNOSIS — I10 Essential (primary) hypertension: Secondary | ICD-10-CM

## 2014-09-12 DIAGNOSIS — R739 Hyperglycemia, unspecified: Secondary | ICD-10-CM

## 2014-09-12 NOTE — Telephone Encounter (Signed)
I have ordered the labs.  Please schedule her for a non fasting lab appt 1-2 days before her 10/25/14 appt.  Thanks.

## 2014-09-12 NOTE — Progress Notes (Signed)
Order placed for labs.

## 2014-09-20 ENCOUNTER — Encounter: Payer: Self-pay | Admitting: *Deleted

## 2014-10-22 ENCOUNTER — Other Ambulatory Visit (INDEPENDENT_AMBULATORY_CARE_PROVIDER_SITE_OTHER): Payer: Medicare Other

## 2014-10-22 DIAGNOSIS — R7989 Other specified abnormal findings of blood chemistry: Secondary | ICD-10-CM

## 2014-10-22 DIAGNOSIS — R739 Hyperglycemia, unspecified: Secondary | ICD-10-CM

## 2014-10-22 DIAGNOSIS — E7800 Pure hypercholesterolemia, unspecified: Secondary | ICD-10-CM

## 2014-10-22 DIAGNOSIS — R945 Abnormal results of liver function studies: Secondary | ICD-10-CM

## 2014-10-22 DIAGNOSIS — I10 Essential (primary) hypertension: Secondary | ICD-10-CM

## 2014-10-22 LAB — LIPID PANEL
CHOL/HDL RATIO: 5
Cholesterol: 216 mg/dL — ABNORMAL HIGH (ref 0–200)
HDL: 41.4 mg/dL (ref >39.00)
LDL CALC: 151 mg/dL — AB (ref 0–99)
NonHDL: 174.6
Triglycerides: 119 mg/dL (ref 0.0–149.0)
VLDL: 23.8 mg/dL (ref 0.0–40.0)

## 2014-10-22 LAB — HEMOGLOBIN A1C: Hgb A1c MFr Bld: 6.1 % (ref 4.6–6.5)

## 2014-10-22 LAB — BASIC METABOLIC PANEL
BUN: 14 mg/dL (ref 6–23)
CO2: 27 mEq/L (ref 19–32)
CREATININE: 0.85 mg/dL (ref 0.40–1.20)
Calcium: 9.3 mg/dL (ref 8.4–10.5)
Chloride: 101 mEq/L (ref 96–112)
GFR: 70.84 mL/min (ref >60.00)
GLUCOSE: 134 mg/dL — AB (ref 70–99)
POTASSIUM: 3.7 meq/L (ref 3.5–5.1)
SODIUM: 137 meq/L (ref 135–145)

## 2014-10-22 LAB — HEPATIC FUNCTION PANEL
ALT: 24 U/L (ref 0–35)
AST: 20 U/L (ref 0–37)
Albumin: 4 g/dL (ref 3.5–5.2)
Alkaline Phosphatase: 70 U/L (ref 39–117)
BILIRUBIN DIRECT: 0.2 mg/dL (ref 0.0–0.3)
TOTAL PROTEIN: 6.8 g/dL (ref 6.0–8.3)
Total Bilirubin: 1.1 mg/dL (ref 0.2–1.2)

## 2014-10-24 ENCOUNTER — Encounter: Payer: Self-pay | Admitting: Internal Medicine

## 2014-10-25 ENCOUNTER — Encounter: Payer: Self-pay | Admitting: Internal Medicine

## 2014-10-25 ENCOUNTER — Ambulatory Visit (INDEPENDENT_AMBULATORY_CARE_PROVIDER_SITE_OTHER): Payer: Medicare Other | Admitting: Internal Medicine

## 2014-10-25 VITALS — BP 120/80 | HR 62 | Temp 98.0°F | Ht 63.0 in | Wt 200.0 lb

## 2014-10-25 DIAGNOSIS — E669 Obesity, unspecified: Secondary | ICD-10-CM

## 2014-10-25 DIAGNOSIS — E7800 Pure hypercholesterolemia, unspecified: Secondary | ICD-10-CM

## 2014-10-25 DIAGNOSIS — Z0000 Encounter for general adult medical examination without abnormal findings: Secondary | ICD-10-CM

## 2014-10-25 DIAGNOSIS — R945 Abnormal results of liver function studies: Secondary | ICD-10-CM

## 2014-10-25 DIAGNOSIS — G4730 Sleep apnea, unspecified: Secondary | ICD-10-CM

## 2014-10-25 DIAGNOSIS — I10 Essential (primary) hypertension: Secondary | ICD-10-CM

## 2014-10-25 DIAGNOSIS — R7989 Other specified abnormal findings of blood chemistry: Secondary | ICD-10-CM

## 2014-10-25 DIAGNOSIS — R739 Hyperglycemia, unspecified: Secondary | ICD-10-CM

## 2014-10-25 DIAGNOSIS — C541 Malignant neoplasm of endometrium: Secondary | ICD-10-CM

## 2014-10-25 MED ORDER — PRAVASTATIN SODIUM 10 MG PO TABS: 10.0000 mg | ORAL_TABLET | Freq: Every day | ORAL | Status: DC

## 2014-10-25 NOTE — Assessment & Plan Note (Signed)
Blood pressure doing well.  Same medications.  Follow met b. Labs reviewed.

## 2014-10-25 NOTE — Assessment & Plan Note (Signed)
Has been followed by gyn.  Her gyn moved.  Wants to start getting her physicals here.

## 2014-10-25 NOTE — Progress Notes (Signed)
Patient ID: Barbara Burns, female   DOB: [birth year], 69 y.o.   MRN: [id number]   Subjective:    Patient ID: Barbara Burns, female    DOB: 21-Jun-1946, 69 y.o.   MRN: [id number]  HPI  Patient here for a scheduled follow up.  Has a history of hypertension and hyper cholesterolemia.  Has been watching her diet and exercising.  Losing weight.  Feels good.  Feels better.  Handling stress well.  No respiratory symptoms.     Past Medical History  Diagnosis Date  . Hypertension   . Hypercholesterolemia   . Endometrial cancer     s/p hysterectomy - 2003  . GERD (gastroesophageal reflux disease)   . Chicken pox     Outpatient Encounter Prescriptions as of 10/25/2014  Medication Sig  . aspirin 81 MG tablet Take 81 mg by mouth daily.  . Calcium Carbonate (CALCIUM 600 PO) Take 1 tablet by mouth 2 (two) times daily.  . Cholecalciferol (VITAMIN D3) 2000 UNITS TABS Take 2,000 Units by mouth daily.  . fish oil-omega-3 fatty acids 1000 MG capsule Take 1 g by mouth daily.  . hydrochlorothiazide (MICROZIDE) 12.5 MG capsule Take 1 capsule (12.5 mg total) by mouth daily.  . irbesartan-hydrochlorothiazide (AVALIDE) 150-12.5 MG per tablet Take 1 tablet by mouth daily.  Marland Kitchen omeprazole (PRILOSEC) 20 MG capsule Take 20 mg by mouth daily.  . Red Yeast Rice 600 MG TABS Take 600 mg by mouth 2 (two) times daily.  . Turmeric 500 MG CAPS Take 500 mg by mouth 2 (two) times daily.  . [DISCONTINUED] fexofenadine (ALLEGRA) 180 MG tablet Take 180 mg by mouth daily.  . pravastatin (PRAVACHOL) 10 MG tablet Take 1 tablet (10 mg total) by mouth daily.    Review of Systems  Constitutional: Negative for fatigue and unexpected weight change (has been trying to lose weight).  HENT: Negative for congestion and sinus pressure.   Respiratory: Negative for cough, chest tightness and shortness of breath.   Cardiovascular: Negative for chest pain, palpitations and leg swelling.  Gastrointestinal: Negative for nausea,  abdominal pain and constipation.       Objective:    Physical Exam  HENT:  Nose: Nose normal.  Mouth/Throat: Oropharynx is clear and moist.  Neck: Neck supple. No thyromegaly present.  Cardiovascular: Normal rate and regular rhythm.   Murmur (1/6 systolic murmur) heard. Pulmonary/Chest: Breath sounds normal. No respiratory distress. She has no wheezes.  Abdominal: Soft. Bowel sounds are normal. There is no tenderness.  Musculoskeletal: She exhibits no edema or tenderness.  Lymphadenopathy:    She has no cervical adenopathy.    BP 120/80 mmHg  Pulse 62  Temp(Src) 98 F (36.7 C) (Oral)  Ht 5' 3"  (1.6 m)  Wt 200 lb (90.719 kg)  BMI 35.44 kg/m2  SpO2 96% Wt Readings from Last 3 Encounters:  10/25/14 200 lb (90.719 kg)  06/27/14 210 lb 12.8 oz (95.618 kg)  05/27/14 210 lb (95.255 kg)    Lab Results  Component Value Date   WBC 8.0 06/22/2014   HGB 14.1 06/22/2014   HCT 41.2 06/22/2014   PLT 287.0 06/22/2014   GLUCOSE 134* 10/22/2014   CHOL 216* 10/22/2014   TRIG 119.0 10/22/2014   HDL 41.40 10/22/2014   LDLDIRECT 154.1 08/11/2013   LDLCALC 151* 10/22/2014   ALT 24 10/22/2014   AST 20 10/22/2014   NA 137 10/22/2014   K 3.7 10/22/2014   CL 101 10/22/2014   CREATININE 0.85 10/22/2014   BUN  14 10/22/2014   CO2 27 10/22/2014   TSH 2.57 02/09/2014   HGBA1C 6.1 10/22/2014       Assessment & Plan:   Problem List Items Addressed This Visit    Abnormal liver function test    Recent liver panel wnl.  Continue diet and exercise.        Relevant Orders   Hepatic function panel   Endometrial cancer    Has been followed by gyn.  Her gyn moved.  Wants to start getting her physicals here.        Health care maintenance    Schedule a physical next visit.        Hypercholesterolemia    Cholesterol has improved.  LDL 151 -  10/22/14 labs.  Continue a low cholesterol diet and exercise.  Start pravastatin 45m q day.  Check liver panel in 6 weeks.        Relevant  Medications   pravastatin (PRAVACHOL) tablet   Other Relevant Orders   Hepatic function panel   Hyperglycemia    Low carb diet and exercise.  A1c 6.1 - 10/22/14.        Hypertension - Primary    Blood pressure doing well.  Same medications.  Follow met b. Labs reviewed.       Relevant Medications   pravastatin (PRAVACHOL) tablet   Obesity (BMI 30-39.9)    She has adjusted her diet.  Has lost weight.  Continue diet and exercise.        Sleep apnea    Continue CPAP.            SEinar Pheasant MD

## 2014-10-25 NOTE — Progress Notes (Signed)
Pre visit review using our clinic review tool, if applicable. No additional management support is needed unless otherwise documented below in the visit note. 

## 2014-10-26 ENCOUNTER — Encounter: Payer: Self-pay | Admitting: Internal Medicine

## 2014-10-26 DIAGNOSIS — Z0000 Encounter for general adult medical examination without abnormal findings: Secondary | ICD-10-CM | POA: Insufficient documentation

## 2014-10-26 DIAGNOSIS — E669 Obesity, unspecified: Secondary | ICD-10-CM | POA: Insufficient documentation

## 2014-10-26 NOTE — Assessment & Plan Note (Signed)
Recent liver panel wnl.  Continue diet and exercise.  

## 2014-10-26 NOTE — Assessment & Plan Note (Signed)
She has adjusted her diet.  Has lost weight.  Continue diet and exercise.   

## 2014-10-26 NOTE — Assessment & Plan Note (Signed)
Continue CPAP.  

## 2014-10-26 NOTE — Assessment & Plan Note (Signed)
Cholesterol has improved.  LDL 151 -  10/22/14 labs.  Continue a low cholesterol diet and exercise.  Start pravastatin 10mg  q day.  Check liver panel in 6 weeks.

## 2014-10-26 NOTE — Assessment & Plan Note (Signed)
Low carb diet and exercise.  A1c 6.1 - 10/22/14.

## 2014-10-26 NOTE — Assessment & Plan Note (Signed)
Schedule a physical next visit.

## 2014-12-06 ENCOUNTER — Other Ambulatory Visit: Payer: Medicare Other

## 2014-12-16 ENCOUNTER — Other Ambulatory Visit: Payer: Self-pay | Admitting: Internal Medicine

## 2014-12-20 ENCOUNTER — Other Ambulatory Visit (INDEPENDENT_AMBULATORY_CARE_PROVIDER_SITE_OTHER): Payer: Medicare Other

## 2014-12-20 DIAGNOSIS — E7800 Pure hypercholesterolemia, unspecified: Secondary | ICD-10-CM

## 2014-12-20 DIAGNOSIS — R7989 Other specified abnormal findings of blood chemistry: Secondary | ICD-10-CM | POA: Diagnosis not present

## 2014-12-20 DIAGNOSIS — R945 Abnormal results of liver function studies: Secondary | ICD-10-CM

## 2014-12-20 LAB — HEPATIC FUNCTION PANEL
ALBUMIN: 4 g/dL (ref 3.5–5.2)
ALK PHOS: 65 U/L (ref 39–117)
ALT: 21 U/L (ref 0–35)
AST: 18 U/L (ref 0–37)
BILIRUBIN DIRECT: 0.2 mg/dL (ref 0.0–0.3)
Total Bilirubin: 1.1 mg/dL (ref 0.2–1.2)
Total Protein: 7.3 g/dL (ref 6.0–8.3)

## 2014-12-21 ENCOUNTER — Encounter: Payer: Self-pay | Admitting: Internal Medicine

## 2014-12-21 ENCOUNTER — Telehealth: Payer: Self-pay | Admitting: Internal Medicine

## 2014-12-21 DIAGNOSIS — E7800 Pure hypercholesterolemia, unspecified: Secondary | ICD-10-CM

## 2014-12-21 DIAGNOSIS — R739 Hyperglycemia, unspecified: Secondary | ICD-10-CM

## 2014-12-21 DIAGNOSIS — R945 Abnormal results of liver function studies: Secondary | ICD-10-CM

## 2014-12-21 DIAGNOSIS — G4730 Sleep apnea, unspecified: Secondary | ICD-10-CM

## 2014-12-21 DIAGNOSIS — R7989 Other specified abnormal findings of blood chemistry: Secondary | ICD-10-CM

## 2014-12-21 DIAGNOSIS — I10 Essential (primary) hypertension: Secondary | ICD-10-CM

## 2014-12-21 NOTE — Telephone Encounter (Signed)
Pt was notified of labs via my chart.  Needs a f/u fasting lab appt 1-2 days before her 02/25/15 appt.   Please notify the patient of the appointment date and time.  Thanks.

## 2015-02-23 ENCOUNTER — Other Ambulatory Visit (INDEPENDENT_AMBULATORY_CARE_PROVIDER_SITE_OTHER): Payer: Medicare Other

## 2015-02-23 ENCOUNTER — Encounter: Payer: Self-pay | Admitting: Internal Medicine

## 2015-02-23 DIAGNOSIS — R739 Hyperglycemia, unspecified: Secondary | ICD-10-CM

## 2015-02-23 DIAGNOSIS — E7800 Pure hypercholesterolemia, unspecified: Secondary | ICD-10-CM

## 2015-02-23 DIAGNOSIS — G4730 Sleep apnea, unspecified: Secondary | ICD-10-CM | POA: Diagnosis not present

## 2015-02-23 LAB — BASIC METABOLIC PANEL
BUN: 16 mg/dL (ref 6–23)
CALCIUM: 9.4 mg/dL (ref 8.4–10.5)
CO2: 31 meq/L (ref 19–32)
CREATININE: 0.88 mg/dL (ref 0.40–1.20)
Chloride: 104 mEq/L (ref 96–112)
GFR: 67.99 mL/min (ref >60.00)
Glucose, Bld: 112 mg/dL — ABNORMAL HIGH (ref 70–99)
POTASSIUM: 4.3 meq/L (ref 3.5–5.1)
SODIUM: 138 meq/L (ref 135–145)

## 2015-02-23 LAB — LIPID PANEL
Cholesterol: 147 mg/dL (ref 0–200)
HDL: 40 mg/dL (ref >39.00)
LDL Cholesterol: 87 mg/dL (ref 0–99)
NONHDL: 107
Total CHOL/HDL Ratio: 4
Triglycerides: 100 mg/dL (ref 0.0–149.0)
VLDL: 20 mg/dL (ref 0.0–40.0)

## 2015-02-23 LAB — HEPATIC FUNCTION PANEL
ALT: 17 U/L (ref 0–35)
AST: 17 U/L (ref 0–37)
Albumin: 4 g/dL (ref 3.5–5.2)
Alkaline Phosphatase: 66 U/L (ref 39–117)
Bilirubin, Direct: 0.1 mg/dL (ref 0.0–0.3)
Total Bilirubin: 0.8 mg/dL (ref 0.2–1.2)
Total Protein: 7.1 g/dL (ref 6.0–8.3)

## 2015-02-23 LAB — MICROALBUMIN / CREATININE URINE RATIO
Creatinine,U: 103 mg/dL
Microalb Creat Ratio: 0.7 mg/g (ref 0.0–30.0)

## 2015-02-23 LAB — TSH: TSH: 3.32 u[IU]/mL (ref 0.35–4.50)

## 2015-02-23 LAB — HEMOGLOBIN A1C: HEMOGLOBIN A1C: 5.6 % (ref 4.6–6.5)

## 2015-02-25 ENCOUNTER — Encounter: Payer: Self-pay | Admitting: Internal Medicine

## 2015-02-25 ENCOUNTER — Ambulatory Visit (INDEPENDENT_AMBULATORY_CARE_PROVIDER_SITE_OTHER): Payer: Medicare Other | Admitting: Internal Medicine

## 2015-02-25 VITALS — BP 110/70 | HR 69 | Temp 98.6°F | Ht 63.0 in | Wt 194.1 lb

## 2015-02-25 DIAGNOSIS — R945 Abnormal results of liver function studies: Secondary | ICD-10-CM

## 2015-02-25 DIAGNOSIS — R221 Localized swelling, mass and lump, neck: Secondary | ICD-10-CM

## 2015-02-25 DIAGNOSIS — C541 Malignant neoplasm of endometrium: Secondary | ICD-10-CM | POA: Diagnosis not present

## 2015-02-25 DIAGNOSIS — G4730 Sleep apnea, unspecified: Secondary | ICD-10-CM

## 2015-02-25 DIAGNOSIS — I10 Essential (primary) hypertension: Secondary | ICD-10-CM

## 2015-02-25 DIAGNOSIS — E7800 Pure hypercholesterolemia, unspecified: Secondary | ICD-10-CM

## 2015-02-25 DIAGNOSIS — Z1211 Encounter for screening for malignant neoplasm of colon: Secondary | ICD-10-CM

## 2015-02-25 DIAGNOSIS — R739 Hyperglycemia, unspecified: Secondary | ICD-10-CM

## 2015-02-25 DIAGNOSIS — Z0000 Encounter for general adult medical examination without abnormal findings: Secondary | ICD-10-CM

## 2015-02-25 DIAGNOSIS — D72829 Elevated white blood cell count, unspecified: Secondary | ICD-10-CM

## 2015-02-25 DIAGNOSIS — E669 Obesity, unspecified: Secondary | ICD-10-CM

## 2015-02-25 DIAGNOSIS — R7989 Other specified abnormal findings of blood chemistry: Secondary | ICD-10-CM

## 2015-02-25 MED ORDER — PRAVASTATIN SODIUM 10 MG PO TABS: ORAL_TABLET | ORAL | Status: DC

## 2015-02-25 NOTE — Progress Notes (Signed)
Patient ID: Barbara Burns, female   DOB: Feb 15, 1946, 69 y.o.   MRN: [id number]   Subjective:    Patient ID: Barbara Burns, female    DOB: 1945-10-22, 69 y.o.   MRN: [id number]  HPI  Patient here to follow up on her current medical issues as well as for a physical exam.  She just had her breast, pelvic and pap smear at gyn.  Still seeing Dr Delsa Sale.  States everything checked out fine.  She has adjusted her diet.  Has lost weight.  Is exercising.  Doing "[employer]" three times per week and swimming one day per week.  No cardiac symptoms with increased activity or exertion.  Did do increased activity this past weekend.  Noticed neck soreness.  Sore to touch.  No soreness now.  Had noticed a hard firm area - right lateral neck.  This has resolved.  Some persistent soft tissue fullness.  No pain.  Overall feels better with the diet adjustment, exercise and weight loss.     Past Medical History  Diagnosis Date  . Hypertension   . Hypercholesterolemia   . Endometrial cancer     s/p hysterectomy - 2003  . GERD (gastroesophageal reflux disease)   . Chicken pox     Current Outpatient Prescriptions on File Prior to Visit  Medication Sig Dispense Refill  . aspirin 81 MG tablet Take 81 mg by mouth daily.    . Calcium Carbonate (CALCIUM 600 PO) Take 1 tablet by mouth 2 (two) times daily.    . Cholecalciferol (VITAMIN D3) 2000 UNITS TABS Take 2,000 Units by mouth daily.    . fish oil-omega-3 fatty acids 1000 MG capsule Take 1 g by mouth daily.    . hydrochlorothiazide (MICROZIDE) 12.5 MG capsule Take 1 capsule (12.5 mg total) by mouth daily. 90 capsule 3  . irbesartan-hydrochlorothiazide (AVALIDE) 150-12.5 MG per tablet Take 1 tablet by mouth daily. 90 tablet 3  . omeprazole (PRILOSEC) 20 MG capsule Take 20 mg by mouth daily.     No current facility-administered medications on file prior to visit.    Review of Systems  Constitutional: Negative for appetite change and  unexpected weight change.  HENT: Negative for congestion and sinus pressure.   Eyes: Negative for pain and visual disturbance.  Respiratory: Negative for cough, chest tightness and shortness of breath.   Cardiovascular: Negative for chest pain, palpitations and leg swelling.  Gastrointestinal: Negative for nausea, vomiting, abdominal pain and diarrhea.  Genitourinary: Negative for dysuria and difficulty urinating.  Musculoskeletal: Positive for neck pain (previously noted.  no pain now.  some soft tissue fulllness - right lateral neck. ). Negative for back pain and joint swelling.  Skin: Negative for color change and rash.  Neurological: Negative for dizziness, light-headedness and headaches.  Hematological: Negative for adenopathy. Does not bruise/bleed easily.  Psychiatric/Behavioral: Negative for dysphoric mood and agitation.       Objective:     Blood pressure recheck:  128/78-80  Physical Exam  Constitutional: She appears well-developed and well-nourished. No distress.  HENT:  Nose: Nose normal.  Mouth/Throat: Oropharynx is clear and moist.  Eyes: Right eye exhibits no discharge. Left eye exhibits no discharge. No scleral icterus.  Neck: Neck supple. No thyromegaly present.  Some increased soft tissue fullness base - right lateral neck.  Non tender.   Cardiovascular: Normal rate and regular rhythm.   Pulmonary/Chest: Breath sounds normal. No respiratory distress. She has no wheezes.  Abdominal: Soft. Bowel sounds are  normal. There is no tenderness.  Musculoskeletal: She exhibits no edema or tenderness.  Lymphadenopathy:    She has no cervical adenopathy.  Skin: No rash noted. No erythema.  Psychiatric: She has a normal mood and affect. Her behavior is normal.    BP 110/70 mmHg  Pulse 69  Temp(Src) 98.6 F (37 C) (Oral)  Ht _0  (1.6 m)  Wt 194 lb 2 oz (88.055 kg)  BMI 34.40 kg/m2  SpO2 96% Wt Readings from Last 3 Encounters:  02/25/15 194 lb 2 oz (88.055 kg)    10/25/14 200 lb (90.719 kg)  06/27/14 210 lb 12.8 oz (95.618 kg)     Lab Results  Component Value Date   WBC 8.0 06/22/2014   HGB 14.1 06/22/2014   HCT 41.2 06/22/2014   PLT 287.0 06/22/2014   GLUCOSE 112* 02/23/2015   CHOL 147 02/23/2015   TRIG 100.0 02/23/2015   HDL 40.00 02/23/2015   LDLDIRECT 154.1 08/11/2013   LDLCALC 87 02/23/2015   ALT 17 02/23/2015   AST 17 02/23/2015   NA 138 02/23/2015   K 4.3 02/23/2015   CL 104 02/23/2015   CREATININE 0.88 02/23/2015   BUN 16 02/23/2015   CO2 31 02/23/2015   TSH 3.32 02/23/2015   HGBA1C 5.6 02/23/2015   MICROALBUR <0.7 02/23/2015       Assessment & Plan:   Problem List Items Addressed This Visit    Abnormal liver function test    Recent liver function tests within normal limits.  Follow.       Relevant Orders   Hepatic function panel   Endometrial cancer    Seeing Dr Delsa Sale.  Just evaluated 12/2014.  Per pt - ok.        Health care maintenance    Mammogram 05/03/14 - Birads I.  Colonoscopy 09/04/10.  Due follow up in 2016.  Pt wants to have done over the summer.   Gyn exam through gyn.  07/14/14 bone density - normal.        Hypercholesterolemia    Low cholesterol diet and exercise.  On pravastatin.  Follow lipid panel and liver function tests.       Relevant Medications   pravastatin (PRAVACHOL) 10 MG tablet   Other Relevant Orders   Lipid panel   Hyperglycemia    Low carb diet and exercise.  Weight down.  Has adjusted diet.  Follow met b and a1c.       Relevant Orders   Hemoglobin A1c   Hypertension    Blood pressure doing well.  Same medication regimen.  Follow pressures.  Follow metabolic panel.       Relevant Medications   pravastatin (PRAVACHOL) 10 MG tablet   Other Relevant Orders   TSH   Basic metabolic panel   Leukocytosis    Last wbc count wnl.  Recheck cbc with next labs.       Neck fullness    Increased soft tissue fullness as outlined.  Soreness has resolved.  No longer  palpate a hard firm area.  Offered CT.  She wants to monitor.  Will notify me if persistent.        Obesity (BMI 30-39.9)    Continue diet adjustment and exercise.  Has lost weight.  Follow.       Sleep apnea    Continue CPAP.         Other Visit Diagnoses    Colon cancer screening    -  Primary  Relevant Orders    Ambulatory referral to Gastroenterology      I spent 25 minutes with the patient and more than 50% of the time was spent in consultation regarding the above.     Einar Pheasant, MD

## 2015-02-25 NOTE — Progress Notes (Signed)
Pre visit review using our clinic review tool, if applicable. No additional management support is needed unless otherwise documented below in the visit note. 

## 2015-02-27 ENCOUNTER — Encounter: Payer: Self-pay | Admitting: Internal Medicine

## 2015-02-27 DIAGNOSIS — R221 Localized swelling, mass and lump, neck: Secondary | ICD-10-CM | POA: Insufficient documentation

## 2015-02-27 NOTE — Assessment & Plan Note (Signed)
Continue CPAP.  

## 2015-02-27 NOTE — Assessment & Plan Note (Signed)
Blood pressure doing well.  Same medication regimen.  Follow pressures.  Follow metabolic panel.   

## 2015-02-27 NOTE — Assessment & Plan Note (Signed)
Seeing Dr Delsa Sale.  Just evaluated 12/2014.  Per pt - ok.

## 2015-02-27 NOTE — Assessment & Plan Note (Signed)
Last wbc count wnl.  Recheck cbc with next labs.

## 2015-02-27 NOTE — Assessment & Plan Note (Signed)
Increased soft tissue fullness as outlined.  Soreness has resolved.  No longer palpate a hard firm area.  Offered CT.  She wants to monitor.  Will notify me if persistent.

## 2015-02-27 NOTE — Assessment & Plan Note (Signed)
Low cholesterol diet and exercise.  On pravastatin.  Follow lipid panel and liver function tests.   

## 2015-02-27 NOTE — Assessment & Plan Note (Signed)
Recent liver function tests within normal limits.  Follow.

## 2015-02-27 NOTE — Assessment & Plan Note (Signed)
Continue diet adjustment and exercise.  Has lost weight.  Follow.

## 2015-02-27 NOTE — Assessment & Plan Note (Signed)
Mammogram 05/03/14 - Birads I.  Colonoscopy 09/04/10.  Due follow up in 2016.  Pt wants to have done over the summer.   Gyn exam through gyn.  07/14/14 bone density - normal.

## 2015-02-27 NOTE — Assessment & Plan Note (Signed)
Low carb diet and exercise.  Weight down.  Has adjusted diet.  Follow met b and a1c.

## 2015-03-24 ENCOUNTER — Encounter: Payer: Self-pay | Admitting: Internal Medicine

## 2015-04-10 ENCOUNTER — Encounter: Payer: Self-pay | Admitting: Internal Medicine

## 2015-04-10 DIAGNOSIS — R221 Localized swelling, mass and lump, neck: Secondary | ICD-10-CM

## 2015-04-11 NOTE — Telephone Encounter (Signed)
Order placed for ENT referral.  Pt notified via my chart.

## 2015-04-25 ENCOUNTER — Other Ambulatory Visit: Payer: Self-pay | Admitting: Unknown Physician Specialty

## 2015-04-25 DIAGNOSIS — R221 Localized swelling, mass and lump, neck: Secondary | ICD-10-CM

## 2015-04-29 ENCOUNTER — Ambulatory Visit: Payer: Medicare Other

## 2015-05-02 ENCOUNTER — Ambulatory Visit: Admission: RE | Admit: 2015-05-02 | Payer: Medicare Other | Source: Ambulatory Visit

## 2015-05-09 ENCOUNTER — Other Ambulatory Visit: Payer: Self-pay | Admitting: *Deleted

## 2015-05-09 ENCOUNTER — Ambulatory Visit
Admission: RE | Admit: 2015-05-09 | Discharge: 2015-05-09 | Disposition: A | Discharge status: Home / Self Care | Payer: Medicare Other | Source: Ambulatory Visit | Attending: Unknown Physician Specialty | Admitting: Unknown Physician Specialty

## 2015-05-09 DIAGNOSIS — R221 Localized swelling, mass and lump, neck: Secondary | ICD-10-CM | POA: Insufficient documentation

## 2015-05-09 MED ORDER — PRAVASTATIN SODIUM 10 MG PO TABS: ORAL_TABLET | ORAL | Status: DC

## 2015-05-09 MED ORDER — IOHEXOL 350 MG/ML SOLN
75.0000 mL | Freq: Once | INTRAVENOUS | Status: AC | PRN
  Administered 2015-05-09: 75 mL via INTRAVENOUS

## 2015-05-20 ENCOUNTER — Ambulatory Visit: Payer: Medicare Other | Admitting: Anesthesiology

## 2015-05-20 ENCOUNTER — Encounter: Payer: Self-pay | Admitting: *Deleted

## 2015-05-20 ENCOUNTER — Encounter
Admission: RE | Disposition: A | Discharge status: Home / Self Care | Payer: Self-pay | Source: Ambulatory Visit | Attending: Unknown Physician Specialty

## 2015-05-20 ENCOUNTER — Ambulatory Visit
Admission: RE | Admit: 2015-05-20 | Discharge: 2015-05-20 | Disposition: A | Discharge status: Home / Self Care | Payer: Medicare Other | Source: Ambulatory Visit | Attending: Unknown Physician Specialty | Admitting: Unknown Physician Specialty

## 2015-05-20 DIAGNOSIS — Z90710 Acquired absence of both cervix and uterus: Secondary | ICD-10-CM | POA: Insufficient documentation

## 2015-05-20 DIAGNOSIS — Z7982 Long term (current) use of aspirin: Secondary | ICD-10-CM | POA: Insufficient documentation

## 2015-05-20 DIAGNOSIS — G4730 Sleep apnea, unspecified: Secondary | ICD-10-CM | POA: Insufficient documentation

## 2015-05-20 DIAGNOSIS — Z87891 Personal history of nicotine dependence: Secondary | ICD-10-CM | POA: Diagnosis not present

## 2015-05-20 DIAGNOSIS — Z8249 Family history of ischemic heart disease and other diseases of the circulatory system: Secondary | ICD-10-CM | POA: Insufficient documentation

## 2015-05-20 DIAGNOSIS — Z79899 Other long term (current) drug therapy: Secondary | ICD-10-CM | POA: Diagnosis not present

## 2015-05-20 DIAGNOSIS — D123 Benign neoplasm of transverse colon: Secondary | ICD-10-CM | POA: Diagnosis not present

## 2015-05-20 DIAGNOSIS — Z807 Family history of other malignant neoplasms of lymphoid, hematopoietic and related tissues: Secondary | ICD-10-CM | POA: Diagnosis not present

## 2015-05-20 DIAGNOSIS — Z811 Family history of alcohol abuse and dependence: Secondary | ICD-10-CM | POA: Diagnosis not present

## 2015-05-20 DIAGNOSIS — Z881 Allergy status to other antibiotic agents status: Secondary | ICD-10-CM | POA: Insufficient documentation

## 2015-05-20 DIAGNOSIS — Z86010 Personal history of colonic polyps: Secondary | ICD-10-CM | POA: Insufficient documentation

## 2015-05-20 DIAGNOSIS — K219 Gastro-esophageal reflux disease without esophagitis: Secondary | ICD-10-CM | POA: Insufficient documentation

## 2015-05-20 DIAGNOSIS — Z8542 Personal history of malignant neoplasm of other parts of uterus: Secondary | ICD-10-CM | POA: Diagnosis not present

## 2015-05-20 DIAGNOSIS — I10 Essential (primary) hypertension: Secondary | ICD-10-CM | POA: Diagnosis not present

## 2015-05-20 DIAGNOSIS — E780 Pure hypercholesterolemia: Secondary | ICD-10-CM | POA: Insufficient documentation

## 2015-05-20 DIAGNOSIS — Z833 Family history of diabetes mellitus: Secondary | ICD-10-CM | POA: Diagnosis not present

## 2015-05-20 DIAGNOSIS — Z823 Family history of stroke: Secondary | ICD-10-CM | POA: Insufficient documentation

## 2015-05-20 DIAGNOSIS — Z8489 Family history of other specified conditions: Secondary | ICD-10-CM | POA: Diagnosis not present

## 2015-05-20 DIAGNOSIS — D122 Benign neoplasm of ascending colon: Secondary | ICD-10-CM | POA: Diagnosis not present

## 2015-05-20 HISTORY — PX: COLONOSCOPY WITH PROPOFOL: SHX5780

## 2015-05-20 LAB — HM COLONOSCOPY: HM Colonoscopy: 3

## 2015-05-20 SURGERY — COLONOSCOPY WITH PROPOFOL
Anesthesia: General

## 2015-05-20 MED ORDER — PROPOFOL INFUSION 10 MG/ML OPTIME
INTRAVENOUS | Status: DC | PRN
  Administered 2015-05-20: 150 ug/kg/min via INTRAVENOUS

## 2015-05-20 MED ORDER — PROPOFOL 10 MG/ML IV BOLUS
INTRAVENOUS | Status: DC | PRN
  Administered 2015-05-20: 100 mg via INTRAVENOUS

## 2015-05-20 MED ORDER — LACTATED RINGERS IV SOLN
INTRAVENOUS | Status: DC | PRN
Start: 2015-05-20 — End: 2015-05-20
  Administered 2015-05-20: 10:00:00 via INTRAVENOUS

## 2015-05-20 MED ORDER — SODIUM CHLORIDE 0.9 % IV SOLN
INTRAVENOUS | Status: DC
Start: 2015-05-20 — End: 2015-05-20
  Administered 2015-05-20: 10:00:00 via INTRAVENOUS

## 2015-05-20 NOTE — Anesthesia Preprocedure Evaluation (Addendum)
Anesthesia Evaluation  Patient identified by MRN, date of birth, ID band  Reviewed: Allergy & Precautions, NPO status , Patient's Chart, lab work & pertinent test results  History of Anesthesia Complications Negative for: history of anesthetic complications  Airway Mallampati: II  TM Distance: >3 FB Neck ROM: Full    Dental  (+) Teeth Intact   Pulmonary sleep apnea (CPAP not using) , former smoker (quit x 40 yrs),          Cardiovascular hypertension, Pt. on medications     Neuro/Psych negative neurological ROS     GI/Hepatic GERD-  Medicated and Controlled,  Endo/Other    Renal/GU      Musculoskeletal   Abdominal   Peds  Hematology   Anesthesia Other Findings   Reproductive/Obstetrics                            Anesthesia Physical Anesthesia Plan  ASA: II  Anesthesia Plan: General   Post-op Pain Management:    Induction: Intravenous  Airway Management Planned:   Additional Equipment:   Intra-op Plan:   Post-operative Plan:   Informed Consent: I have reviewed the patients History and Physical, chart, labs and discussed the procedure including the risks, benefits and alternatives for the proposed anesthesia with the patient or authorized representative who has indicated his/her understanding and acceptance.     Plan Discussed with:   Anesthesia Plan Comments:         Anesthesia Quick Evaluation

## 2015-05-20 NOTE — Op Note (Signed)
[facility] Gastroenterology Patient Name: Barbara Burns Procedure Date: 05/20/2015 9:46 AM MRN: [id number] Account #: [id number] Date of Birth: November 14, 1945 Admit Type: Outpatient Age: 69 Room: [facility] ENDO ROOM 1 Gender: Female Note Status: Finalized Procedure:         Colonoscopy Indications:       Personal history of colonic polyps Providers:         Manya Silvas, MD Referring MD:      Einar Pheasant, MD (Referring MD) Medicines:         Propofol per Anesthesia Complications:     No immediate complications. Procedure:         Pre-Anesthesia Assessment:                    - After reviewing the risks and benefits, the patient was                     deemed in satisfactory condition to undergo the procedure.                    After obtaining informed consent, the colonoscope was                     passed under direct vision. Throughout the procedure, the                     patient's blood pressure, pulse, and oxygen saturations                     were monitored continuously. The Colonoscope was                     introduced through the anus and advanced to the the cecum,                     identified by appendiceal orifice and ileocecal valve. The                     colonoscopy was performed without difficulty. The patient                     tolerated the procedure well. The quality of the bowel                     preparation was good. Findings:      Three sessile polyps were found in the transverse colon and in the       ascending colon. The polyps were diminutive in size. These polyps were       removed with a jumbo cold forceps. Resection and retrieval were complete.      The exam was otherwise without abnormality. Impression:        - Three diminutive polyps in the transverse colon and in                     the ascending colon. Resected and retrieved.                    - The examination was otherwise normal. Recommendation:    - Await pathology  results. Manya Silvas, MD 05/20/2015 10:24:22 AM This report has been signed electronically. Number of Addenda: 0 Note Initiated On: 05/20/2015 9:46 AM Scope Withdrawal Time: 0 hours 11 minutes 6 seconds  Total Procedure Duration: 0 hours 22 minutes 54 seconds  [facility]

## 2015-05-20 NOTE — H&P (Signed)
Consultation  Referring Provider:      Primary Care Physician:  Einar Pheasant, MD Primary Gastroenterologist:         Reason for Consultation:          Impression / Plan:             HPI:   Barbara Burns is a 69 y.o. female   Past Medical History  Diagnosis Date  . Hypertension   . Hypercholesterolemia   . Endometrial cancer     s/p hysterectomy - 2003  . GERD (gastroesophageal reflux disease)   . Chicken pox     Past Surgical History  Procedure Laterality Date  . Abdominal hysterectomy      total, may 2003  . Appendectomy      age 21  . Oophorectomy      left, secondary to ovarian cyst    Family History  Problem Relation Age of Onset  . Sleep apnea Father     resulting right heart failure  . Heart disease Father   . Hypertension Father   . Stroke Maternal Grandmother   . Alcohol abuse Maternal Grandfather   . Diabetes Paternal Grandmother   . Lymphoma Mother     non hodgkins lymphoma     Social History  Substance Use Topics  . Smoking status: Former Research scientist (life sciences)  . Smokeless tobacco: Never Used  . Alcohol Use: No    Prior to Admission medications   Medication Sig Start Date End Date Taking? Authorizing Provider  aspirin 81 MG tablet Take 81 mg by mouth daily.   Yes Historical Provider, MD  Calcium Carbonate (CALCIUM 600 PO) Take 1 tablet by mouth 2 (two) times daily.   Yes Historical Provider, MD  Cholecalciferol (VITAMIN D3) 2000 UNITS TABS Take 2,000 Units by mouth daily.   Yes Historical Provider, MD  co-enzyme Q-10 50 MG capsule Take 50 mg by mouth daily.   Yes Historical Provider, MD  fish oil-omega-3 fatty acids 1000 MG capsule Take 1 g by mouth daily.   Yes Historical Provider, MD  irbesartan-hydrochlorothiazide (AVALIDE) 150-12.5 MG per tablet Take 1 tablet by mouth daily. 06/24/14  Yes Einar Pheasant, MD  omeprazole (PRILOSEC) 20 MG capsule Take 20 mg by mouth daily.   Yes Historical Provider, MD  pravastatin (PRAVACHOL) 10 MG tablet TAKE 1  TABLET (10 MG TOTAL) BY MOUTH DAILY. 05/09/15  Yes Einar Pheasant, MD  hydrochlorothiazide (MICROZIDE) 12.5 MG capsule Take 1 capsule (12.5 mg total) by mouth daily. Patient not taking: Reported on 05/20/2015 06/24/14   Einar Pheasant, MD    Current Facility-Administered Medications  Medication Dose Route Frequency Provider Last Rate Last Dose  . 0.9 %  sodium chloride infusion   Intravenous Continuous Manya Silvas, MD 20 mL/hr at 05/20/15 9450      Allergies as of 04/19/2015 - Review Complete 02/27/2015  Allergen Reaction Noted  . Erythromycin Nausea And Vomiting 08/11/2012     Review of Systems:    This is positive for those things mentioned in the HPI, also positive for . All other review of systems are negative.       Physical Exam:  Vital signs in last 24 hours: Temp:  [98.3 F (36.8 C)] 98.3 F (36.8 C) (08/26 0922) Pulse Rate:  [64] 64 (08/26 0922) Resp:  [12] 12 (08/26 0922) BP: (145)/(73) 145/73 mmHg (08/26 0922) SpO2:  [98 %] 98 % (08/26 0922) Weight:  [85.73 kg (189 lb)] 85.73 kg (189 lb) (08/26 0922)  General:  Well-developed, well-nourished and in no acute distress Eyes:  anicteric. ENT:   Mouth and posterior pharynx free of lesions.  Neck:   supple w/o thyromegaly or mass.  Lungs: Clear to auscultation bilaterally. Heart:  S1S2, no rubs, murmurs, gallops. Abdomen:  soft, non-tender, no hepatosplenomegaly, hernia, or mass and BS+.  Rectal: Lymph:  no cervical or supraclavicular adenopathy. Extremities:   no edema Skin   no rash. Neuro:  A&O x 3.  Psych:  appropriate mood and  Affect.   Data Reviewed:   LAB RESULTS: No results for input(s): WBC, HGB, HCT, PLT in the last 72 hours. BMET No results for input(s): NA, K, CL, CO2, GLUCOSE, BUN, CREATININE, CALCIUM in the last 72 hours. LFT No results for input(s): PROT, ALBUMIN, AST, ALT, ALKPHOS, BILITOT, BILIDIR, IBILI in the last 72 hours. PT/INR No results for input(s): LABPROT, INR in the  last 72 hours.  STUDIES: No results found.   PREVIOUS ENDOSCOPIES:               Thanks     @Carl  Simonne Maffucci, MD, [facility] @  05/20/2015, 9:46 AM   Primary Care Physician:  Einar Pheasant, MD Primary Gastroenterologist:  Dr. Vira Agar  Pre-Procedure History & Physical: HPI:  Barbara Burns is a 69 y.o. female is here for an colonoscopy.   Past Medical History  Diagnosis Date  . Hypertension   . Hypercholesterolemia   . Endometrial cancer     s/p hysterectomy - 2003  . GERD (gastroesophageal reflux disease)   . Chicken pox     Past Surgical History  Procedure Laterality Date  . Abdominal hysterectomy      total, may 2003  . Appendectomy      age 53  . Oophorectomy      left, secondary to ovarian cyst    Prior to Admission medications   Medication Sig Start Date End Date Taking? Authorizing Provider  aspirin 81 MG tablet Take 81 mg by mouth daily.   Yes Historical Provider, MD  Calcium Carbonate (CALCIUM 600 PO) Take 1 tablet by mouth 2 (two) times daily.   Yes Historical Provider, MD  Cholecalciferol (VITAMIN D3) 2000 UNITS TABS Take 2,000 Units by mouth daily.   Yes Historical Provider, MD  co-enzyme Q-10 50 MG capsule Take 50 mg by mouth daily.   Yes Historical Provider, MD  fish oil-omega-3 fatty acids 1000 MG capsule Take 1 g by mouth daily.   Yes Historical Provider, MD  irbesartan-hydrochlorothiazide (AVALIDE) 150-12.5 MG per tablet Take 1 tablet by mouth daily. 06/24/14  Yes Einar Pheasant, MD  omeprazole (PRILOSEC) 20 MG capsule Take 20 mg by mouth daily.   Yes Historical Provider, MD  pravastatin (PRAVACHOL) 10 MG tablet TAKE 1 TABLET (10 MG TOTAL) BY MOUTH DAILY. 05/09/15  Yes Einar Pheasant, MD  hydrochlorothiazide (MICROZIDE) 12.5 MG capsule Take 1 capsule (12.5 mg total) by mouth daily. Patient not taking: Reported on 05/20/2015 06/24/14   Einar Pheasant, MD    Allergies as of 04/19/2015 - Review Complete 02/27/2015  Allergen Reaction Noted  .  Erythromycin Nausea And Vomiting 08/11/2012    Family History  Problem Relation Age of Onset  . Sleep apnea Father     resulting right heart failure  . Heart disease Father   . Hypertension Father   . Stroke Maternal Grandmother   . Alcohol abuse Maternal Grandfather   . Diabetes Paternal Grandmother   . Lymphoma Mother     non hodgkins lymphoma  Social History   Social History  . Marital Status: Married    Spouse Name: N/A  . Number of Children: N/A  . Years of Education: N/A   Occupational History  . Not on file.   Social History Main Topics  . Smoking status: Former Research scientist (life sciences)  . Smokeless tobacco: Never Used  . Alcohol Use: No  . Drug Use: No  . Sexual Activity: No   Other Topics Concern  . Not on file   Social History Narrative    Review of Systems: See HPI, otherwise negative ROS  Physical Exam: BP 145/73 mmHg  Pulse 64  Temp(Src) 98.3 F (36.8 C) (Tympanic)  Resp 12  Ht 5\' 3"  (1.6 m)  Wt 85.73 kg (189 lb)  BMI 33.49 kg/m2  SpO2 98% General:   Alert,  pleasant and cooperative in NAD Head:  Normocephalic and atraumatic. Neck:  Supple; no masses or thyromegaly. Lungs:  Clear throughout to auscultation.    Heart:  Regular rate and rhythm. Abdomen:  Soft, nontender and nondistended. Normal bowel sounds, without guarding, and without rebound.   Neurologic:  Alert and  oriented x4;  grossly normal neurologically.  Impression/Plan: Barbara Burns is here for an colonoscopy to be performed for [facility] colon polyps  Risks, benefits, limitations, and alternatives regarding  colonoscopy have been reviewed with the patient.  Questions have been answered.  All parties agreeable.   Gaylyn Cheers, MD  05/20/2015, 9:49 AM     Gaylyn Cheers, MD  05/20/2015, 9:50 AM

## 2015-05-20 NOTE — Transfer of Care (Signed)
Immediate Anesthesia Transfer of Care Note  Patient: Chamika Cunanan  Procedure(s) Performed: Procedure(s): COLONOSCOPY WITH PROPOFOL (N/A)  Patient Location: PACU  Anesthesia Type:General  Level of Consciousness: awake, alert  and oriented  Airway & Oxygen Therapy: Patient Spontanous Breathing and Patient connected to nasal cannula oxygen  Post-op Assessment: Report given to RN and Post -op Vital signs reviewed and stable  Post vital signs: Reviewed and stable  Last Vitals:  Filed Vitals:   05/20/15 0922  BP: 145/73  Pulse: 64  Temp: 36.8 C  Resp: 12    Complications: No apparent anesthesia complications

## 2015-05-20 NOTE — Transfer of Care (Signed)
Immediate Anesthesia Transfer of Care Note  Patient: Barbara Burns  Procedure(s) Performed: Procedure(s): COLONOSCOPY WITH PROPOFOL (N/A)  Patient Location: PACU  Anesthesia Type:General  Level of Consciousness: awake, alert  and sedated  Airway & Oxygen Therapy: Patient Spontanous Breathing and Patient connected to nasal cannula oxygen  Post-op Assessment: Report given to RN  Post vital signs: Reviewed and stable  Last Vitals:  Filed Vitals:   05/20/15 0922  BP: 145/73  Pulse: 64  Temp: 36.8 C  Resp: 12    Complications: No apparent anesthesia complications

## 2015-05-20 NOTE — Anesthesia Postprocedure Evaluation (Signed)
  Anesthesia Post-op Note  Patient: Barbara Burns  Procedure(s) Performed: Procedure(s): COLONOSCOPY WITH PROPOFOL (N/A)  Anesthesia type:General  Patient location: PACU  Post pain: Pain level controlled  Post assessment: Post-op Vital signs reviewed, Patient's Cardiovascular Status Stable, Respiratory Function Stable, Patent Airway and No signs of Nausea or vomiting  Post vital signs: Reviewed and stable  Last Vitals:  Filed Vitals:   05/20/15 1055  BP: 133/68  Pulse: 51  Temp:   Resp: 14    Level of consciousness: awake, alert  and patient cooperative  Complications: No apparent anesthesia complications

## 2015-05-24 LAB — SURGICAL PATHOLOGY

## 2015-05-25 ENCOUNTER — Encounter: Payer: Self-pay | Admitting: Internal Medicine

## 2015-05-25 DIAGNOSIS — Z86010 Personal history of colonic polyps: Secondary | ICD-10-CM | POA: Insufficient documentation

## 2015-06-01 ENCOUNTER — Encounter: Payer: Self-pay | Admitting: Internal Medicine

## 2015-07-20 ENCOUNTER — Encounter: Payer: Self-pay | Admitting: Physician Assistant

## 2015-07-20 ENCOUNTER — Ambulatory Visit: Payer: Self-pay | Admitting: Physician Assistant

## 2015-07-20 VITALS — BP 120/80 | HR 66 | Temp 98.5°F

## 2015-07-20 DIAGNOSIS — H109 Unspecified conjunctivitis: Secondary | ICD-10-CM

## 2015-07-20 DIAGNOSIS — Z299 Encounter for prophylactic measures, unspecified: Secondary | ICD-10-CM

## 2015-07-20 MED ORDER — TOBRAMYCIN 0.3 % OP SOLN: 2.0000 [drp] | OPHTHALMIC | Status: DC

## 2015-07-20 NOTE — Progress Notes (Signed)
S:  C/o left and right  eye being irritated, matted, sx started last pm, has hx of seasonal allergies but this is different, denies, cough, congestion, fever, chills, v/d; remainder ros neg, left eye is a little better but still matted this am  O: vitals wnl, nad, perrl eomi, left and right  eye with injected conjunctiva, no drainage or matting noted at this time, tms clear, nasal mucosa inflamed, throat wnl, neck supple no lymph, lungs c t a, cv rrr  A:  Acute conjunctivitis  P: tobramycin opth gtts, return if not better in 3 - 5d, if worsening return earlier or see eye doctor, no work until eye is no longer red or draining

## 2015-08-05 ENCOUNTER — Other Ambulatory Visit: Payer: Self-pay

## 2015-08-05 MED ORDER — IRBESARTAN-HYDROCHLOROTHIAZIDE 150-12.5 MG PO TABS: 1.0000 | ORAL_TABLET | Freq: Every day | ORAL | Status: DC

## 2015-08-05 MED ORDER — HYDROCHLOROTHIAZIDE 12.5 MG PO CAPS: 12.5000 mg | ORAL_CAPSULE | Freq: Every day | ORAL | Status: DC

## 2015-08-09 ENCOUNTER — Other Ambulatory Visit: Payer: Self-pay

## 2015-08-09 MED ORDER — HYDROCHLOROTHIAZIDE 12.5 MG PO CAPS: 12.5000 mg | ORAL_CAPSULE | Freq: Every day | ORAL | Status: DC

## 2015-08-09 MED ORDER — IRBESARTAN-HYDROCHLOROTHIAZIDE 150-12.5 MG PO TABS: 1.0000 | ORAL_TABLET | Freq: Every day | ORAL | Status: DC

## 2015-08-26 ENCOUNTER — Other Ambulatory Visit (INDEPENDENT_AMBULATORY_CARE_PROVIDER_SITE_OTHER): Payer: Medicare Other

## 2015-08-26 DIAGNOSIS — R7989 Other specified abnormal findings of blood chemistry: Secondary | ICD-10-CM

## 2015-08-26 DIAGNOSIS — I10 Essential (primary) hypertension: Secondary | ICD-10-CM | POA: Diagnosis not present

## 2015-08-26 DIAGNOSIS — E7800 Pure hypercholesterolemia, unspecified: Secondary | ICD-10-CM | POA: Diagnosis not present

## 2015-08-26 DIAGNOSIS — R739 Hyperglycemia, unspecified: Secondary | ICD-10-CM | POA: Diagnosis not present

## 2015-08-26 DIAGNOSIS — R945 Abnormal results of liver function studies: Secondary | ICD-10-CM

## 2015-08-26 LAB — HEPATIC FUNCTION PANEL
ALBUMIN: 4.3 g/dL (ref 3.5–5.2)
ALK PHOS: 75 U/L (ref 39–117)
ALT: 13 U/L (ref 0–35)
AST: 17 U/L (ref 0–37)
BILIRUBIN DIRECT: 0.2 mg/dL (ref 0.0–0.3)
TOTAL PROTEIN: 7.7 g/dL (ref 6.0–8.3)
Total Bilirubin: 1.2 mg/dL (ref 0.2–1.2)

## 2015-08-26 LAB — LIPID PANEL
CHOL/HDL RATIO: 4
Cholesterol: 176 mg/dL (ref 0–200)
HDL: 49.6 mg/dL (ref >39.00)
LDL Cholesterol: 106 mg/dL — ABNORMAL HIGH (ref 0–99)
NONHDL: 126.37
Triglycerides: 100 mg/dL (ref 0.0–149.0)
VLDL: 20 mg/dL (ref 0.0–40.0)

## 2015-08-26 LAB — HEMOGLOBIN A1C: HEMOGLOBIN A1C: 5.7 % (ref 4.6–6.5)

## 2015-08-26 LAB — BASIC METABOLIC PANEL
BUN: 19 mg/dL (ref 6–23)
CHLORIDE: 102 meq/L (ref 96–112)
CO2: 29 mEq/L (ref 19–32)
Calcium: 9.8 mg/dL (ref 8.4–10.5)
Creatinine, Ser: 0.9 mg/dL (ref 0.40–1.20)
GFR: 66.15 mL/min (ref >60.00)
Glucose, Bld: 99 mg/dL (ref 70–99)
POTASSIUM: 3.8 meq/L (ref 3.5–5.1)
SODIUM: 140 meq/L (ref 135–145)

## 2015-08-26 LAB — TSH: TSH: 3.88 u[IU]/mL (ref 0.35–4.50)

## 2015-08-27 ENCOUNTER — Encounter: Payer: Self-pay | Admitting: Internal Medicine

## 2015-08-29 ENCOUNTER — Ambulatory Visit (INDEPENDENT_AMBULATORY_CARE_PROVIDER_SITE_OTHER): Payer: Medicare Other | Admitting: Internal Medicine

## 2015-08-29 ENCOUNTER — Encounter: Payer: Self-pay | Admitting: Internal Medicine

## 2015-08-29 VITALS — BP 98/60 | HR 61 | Temp 98.0°F | Resp 18 | Ht 63.0 in | Wt 189.0 lb

## 2015-08-29 DIAGNOSIS — E7800 Pure hypercholesterolemia, unspecified: Secondary | ICD-10-CM

## 2015-08-29 DIAGNOSIS — I10 Essential (primary) hypertension: Secondary | ICD-10-CM | POA: Diagnosis not present

## 2015-08-29 DIAGNOSIS — R739 Hyperglycemia, unspecified: Secondary | ICD-10-CM

## 2015-08-29 DIAGNOSIS — G4730 Sleep apnea, unspecified: Secondary | ICD-10-CM | POA: Diagnosis not present

## 2015-08-29 DIAGNOSIS — C541 Malignant neoplasm of endometrium: Secondary | ICD-10-CM

## 2015-08-29 DIAGNOSIS — Z1239 Encounter for other screening for malignant neoplasm of breast: Secondary | ICD-10-CM | POA: Diagnosis not present

## 2015-08-29 DIAGNOSIS — R221 Localized swelling, mass and lump, neck: Secondary | ICD-10-CM

## 2015-08-29 DIAGNOSIS — R7989 Other specified abnormal findings of blood chemistry: Secondary | ICD-10-CM

## 2015-08-29 DIAGNOSIS — R945 Abnormal results of liver function studies: Secondary | ICD-10-CM

## 2015-08-29 DIAGNOSIS — Z86010 Personal history of colonic polyps: Secondary | ICD-10-CM

## 2015-08-29 DIAGNOSIS — E669 Obesity, unspecified: Secondary | ICD-10-CM

## 2015-08-29 NOTE — Progress Notes (Signed)
Pre-visit discussion using our clinic review tool. No additional management support is needed unless otherwise documented below in the visit note.  

## 2015-08-29 NOTE — Progress Notes (Signed)
Patient ID: Barbara Burns, female   DOB: Aug 16, 1946, 69 y.o.   MRN: [id number]   Subjective:    Patient ID: Barbara Burns, female    DOB: 11-Apr-1946, 69 y.o.   MRN: [id number]  HPI  Patient with past history of endometrial cancer, hypertension and hypercholesterolemia.  She comes in today to follow up on these issues.  She is doing well.  Is exercising.  Feels better.  Some allergies affecting her eyes.  No significant problems.  No cardiac symptoms with increased activity or exertion.  No sob.  No acid reflux.  No abdominal pain or cramping.  Bowels stable.  Seeing Dr Delsa Sale.  Due f/u in the spring.  Blood pressure has been running low.  Handling stress well.     Past Medical History  Diagnosis Date  . Hypertension   . Hypercholesterolemia   . Endometrial cancer [facility])     s/p hysterectomy - 2003  . GERD (gastroesophageal reflux disease)   . Chicken pox    Past Surgical History  Procedure Laterality Date  . Abdominal hysterectomy      total, may 2003  . Appendectomy      age 38  . Oophorectomy      left, secondary to ovarian cyst  . Colonoscopy with propofol N/A 05/20/2015    Procedure: COLONOSCOPY WITH PROPOFOL;  Surgeon: Manya Silvas, MD;  Location: [facility] ENDOSCOPY;  Service: Endoscopy;  Laterality: N/A;   Family History  Problem Relation Age of Onset  . Sleep apnea Father     resulting right heart failure  . Heart disease Father   . Hypertension Father   . Stroke Maternal Grandmother   . Alcohol abuse Maternal Grandfather   . Diabetes Paternal Grandmother   . Lymphoma Mother     non hodgkins lymphoma   Social History   Social History  . Marital Status: Married    Spouse Name: N/A  . Number of Children: N/A  . Years of Education: N/A   Social History Main Topics  . Smoking status: Former Research scientist (life sciences)  . Smokeless tobacco: Never Used  . Alcohol Use: No  . Drug Use: No  . Sexual Activity: No   Other Topics Concern  . None   Social History  Narrative    Outpatient Encounter Prescriptions as of 08/29/2015  Medication Sig  . aspirin 81 MG tablet Take 81 mg by mouth daily.  . Calcium Carbonate (CALCIUM 600 PO) Take 1 tablet by mouth 2 (two) times daily.  . Cholecalciferol (VITAMIN D3) 2000 UNITS TABS Take 2,000 Units by mouth daily.  Marland Kitchen co-enzyme Q-10 50 MG capsule Take 50 mg by mouth daily.  . fish oil-omega-3 fatty acids 1000 MG capsule Take 1 g by mouth daily.  . hydrochlorothiazide (MICROZIDE) 12.5 MG capsule Take 1 capsule (12.5 mg total) by mouth daily.  . irbesartan-hydrochlorothiazide (AVALIDE) 150-12.5 MG tablet Take 1 tablet by mouth daily.  Marland Kitchen omeprazole (PRILOSEC) 20 MG capsule Take 20 mg by mouth daily.  . pravastatin (PRAVACHOL) 10 MG tablet TAKE 1 TABLET (10 MG TOTAL) BY MOUTH DAILY.  . [DISCONTINUED] tobramycin (TOBREX) 0.3 % ophthalmic solution Place 2 drops into both eyes every 4 (four) hours.   No facility-administered encounter medications on file as of 08/29/2015.    Review of Systems  Constitutional: Negative for appetite change and unexpected weight change.  HENT: Negative for congestion and sinus pressure.        Some allergy symptoms.    Eyes: Negative for  pain and visual disturbance.  Respiratory: Negative for cough, chest tightness and shortness of breath.   Cardiovascular: Negative for chest pain, palpitations and leg swelling.  Gastrointestinal: Negative for nausea, vomiting, abdominal pain and diarrhea.  Genitourinary: Negative for dysuria and difficulty urinating.  Musculoskeletal: Negative for back pain and joint swelling.  Skin: Negative for color change and rash.  Neurological: Negative for dizziness, light-headedness and headaches.  Psychiatric/Behavioral: Negative for dysphoric mood and agitation.       Objective:    Physical Exam  Constitutional: She appears well-developed and well-nourished. No distress.  HENT:  Nose: Nose normal.  Mouth/Throat: Oropharynx is clear and moist.    Eyes: Conjunctivae are normal. Right eye exhibits no discharge. Left eye exhibits no discharge.  Neck: Neck supple. No thyromegaly present.  Cardiovascular: Normal rate and regular rhythm.   Pulmonary/Chest: Breath sounds normal. No respiratory distress. She has no wheezes.  Abdominal: Soft. Bowel sounds are normal. There is no tenderness.  Musculoskeletal: She exhibits no edema or tenderness.  Lymphadenopathy:    She has no cervical adenopathy.  Skin: No rash noted. No erythema.  Psychiatric: She has a normal mood and affect. Her behavior is normal.    BP 98/60 mmHg  Pulse 61  Temp(Src) 98 F (36.7 C) (Oral)  Resp 18  Ht _0  (1.6 m)  Wt 189 lb (85.73 kg)  BMI 33.49 kg/m2  SpO2 97% Wt Readings from Last 3 Encounters:  08/29/15 189 lb (85.73 kg)  05/20/15 189 lb (85.73 kg)  02/25/15 194 lb 2 oz (88.055 kg)     Lab Results  Component Value Date   WBC 8.0 06/22/2014   HGB 14.1 06/22/2014   HCT 41.2 06/22/2014   PLT 287.0 06/22/2014   GLUCOSE 99 08/26/2015   CHOL 176 08/26/2015   TRIG 100.0 08/26/2015   HDL 49.60 08/26/2015   LDLDIRECT 154.1 08/11/2013   LDLCALC 106* 08/26/2015   ALT 13 08/26/2015   AST 17 08/26/2015   NA 140 08/26/2015   K 3.8 08/26/2015   CL 102 08/26/2015   CREATININE 0.90 08/26/2015   BUN 19 08/26/2015   CO2 29 08/26/2015   TSH 3.88 08/26/2015   HGBA1C 5.7 08/26/2015   MICROALBUR <0.7 02/23/2015    Ct Soft Tissue Neck W Contrast  05/09/2015  CLINICAL DATA:  RIGHT medial supraclavicular neck soft tissue swelling for 3 months. Evaluate for supraclavicular lipoma. Family history of lymphoma. History of endometrial cancer. EXAM: CT NECK WITH CONTRAST TECHNIQUE: Multidetector CT imaging of the neck was performed using the standard protocol following the bolus administration of intravenous contrast. CONTRAST:  47m OMNIPAQUE IOHEXOL 350 MG/ML SOLN COMPARISON:  None. FINDINGS: Pharynx and larynx: Normal. Salivary glands: Normal. Thyroid: Normal.  Lymph nodes: No pathologic adenopathy. Vascular: Patent. Limited intracranial: Normal. Visualized orbits: Negative. Mastoids and visualized paranasal sinuses: Negative. Skeleton: Moderately advanced cervical spondylosis with severe disc space narrowing at C6-C7. Upper chest: No lung apex lesion. There is no specific abnormality in the supraclavicular soft tissues on the RIGHT versus LEFT which can be identified. Subcutaneous adipose tissue appears symmetric. No discrete lipoma is evident. IMPRESSION: No discrete abnormality is seen in the RIGHT medial supraclavicular neck. Symmetric subcutaneous adipose tissue is observed. No pathologic adenopathy is evident. Electronically Signed   By: JStaci RighterM.D.   On: 05/09/2015 14:40       Assessment & Plan:   Problem List Items Addressed This Visit    Abnormal liver function test    Liver panel checked  08/26/15 - wnl.       Endometrial cancer ([place])    Followed by Dr Delsa Sale.  Last evaluated 12/2014.  Continue yearly follow up.        History of colonic polyps    Colonoscopy 05/20/15 - as outlined.  Follow colonoscopy recommended 04/2020.        Hypercholesterolemia    Low cholesterol diet and exercise.  Follow lipid panel and liver function tests.  On pravastatin.        Relevant Orders   Lipid panel   Hepatic function panel   Hyperglycemia    Low carb diet and exercise.  Maintaining weight.  Follow met b and a1c.  Recent check 5.7.        Relevant Orders   Hemoglobin A1c   Hypertension    Blood pressure running low on some checks.  Stop the hctz.  Follow pressures.  Follow metabolic panel.  Get her back in soon to reassess.        Relevant Orders   CBC with Differential/Platelet   Basic metabolic panel   Neck fullness    CT as outlined.  Saw ENT.        Obesity (BMI 30-39.9)    Diet and exercise.  Follow.       Sleep apnea    Continue CPAP.         Other Visit Diagnoses    Breast cancer screening    -  Primary     Relevant Orders    MM DIGITAL SCREENING BILATERAL        Einar Pheasant, MD

## 2015-09-04 ENCOUNTER — Encounter: Payer: Self-pay | Admitting: Internal Medicine

## 2015-09-04 NOTE — Assessment & Plan Note (Signed)
Continue CPAP.  

## 2015-09-04 NOTE — Assessment & Plan Note (Signed)
Blood pressure running low on some checks.  Stop the hctz.  Follow pressures.  Follow metabolic panel.  Get her back in soon to reassess.

## 2015-09-04 NOTE — Assessment & Plan Note (Signed)
Low carb diet and exercise.  Maintaining weight.  Follow met b and a1c.  Recent check 5.7.

## 2015-09-04 NOTE — Assessment & Plan Note (Addendum)
Followed by Dr Delsa Sale.  Last evaluated 12/2014.  Continue yearly follow up.

## 2015-09-04 NOTE — Assessment & Plan Note (Signed)
Low cholesterol diet and exercise.  Follow lipid panel and liver function tests.  On pravastatin.   

## 2015-09-04 NOTE — Assessment & Plan Note (Signed)
Liver panel checked 08/26/15 - wnl.

## 2015-09-04 NOTE — Assessment & Plan Note (Signed)
CT as outlined.  Saw ENT.

## 2015-09-04 NOTE — Assessment & Plan Note (Signed)
Diet and exercise.  Follow.  

## 2015-09-04 NOTE — Assessment & Plan Note (Signed)
Colonoscopy 05/20/15 - as outlined.  Follow colonoscopy recommended 04/2020.

## 2015-09-07 IMAGING — CT CT NECK W/ CM
3 of 5 series · 13 of 33 positions shown, 16 images · IV contrast (omnipaque)
Comparison: None.

CLINICAL DATA: RIGHT medial supraclavicular neck soft tissue
swelling for 3 months. Evaluate for supraclavicular lipoma. Family
history of lymphoma. History of endometrial cancer.

EXAM:
CT NECK WITH CONTRAST
TECHNIQUE: Multidetector CT imaging of the neck was performed using the
standard protocol following the bolus administration of intravenous
contrast.
CONTRAST:  75mL OMNIPAQUE IOHEXOL 350 MG/ML SOLN

[Series 4: sag neck · sagittal · 0.41mm/px · 5 of 159 slices shown, 6 images]
[im 53/159  bone]
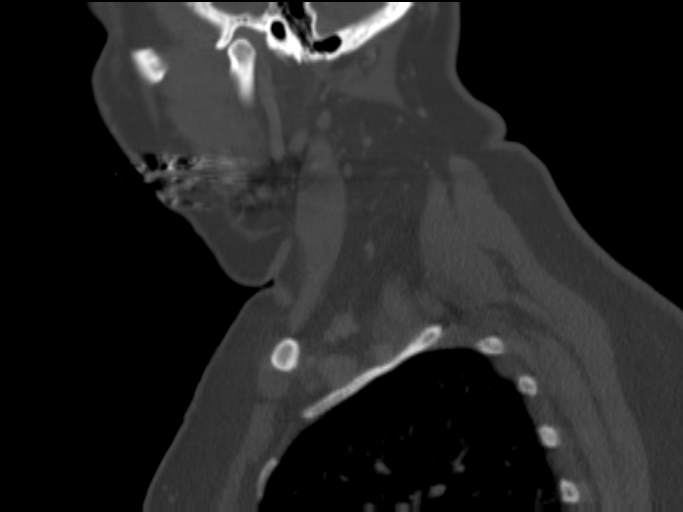
[im 66/159  bone]
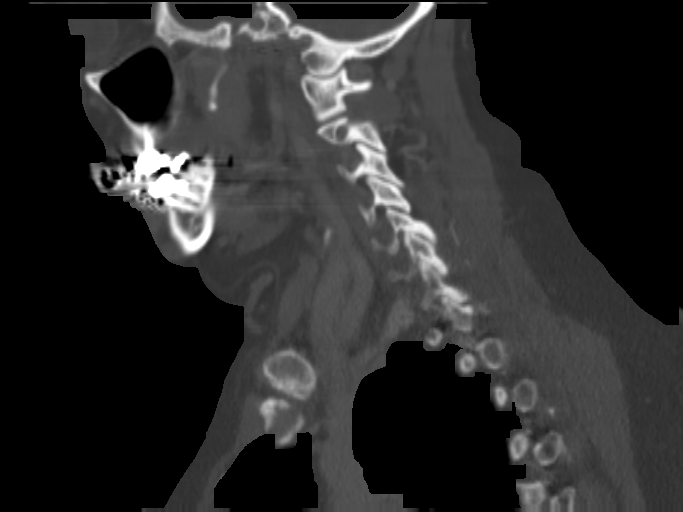
[im 80/159  soft-tissue]
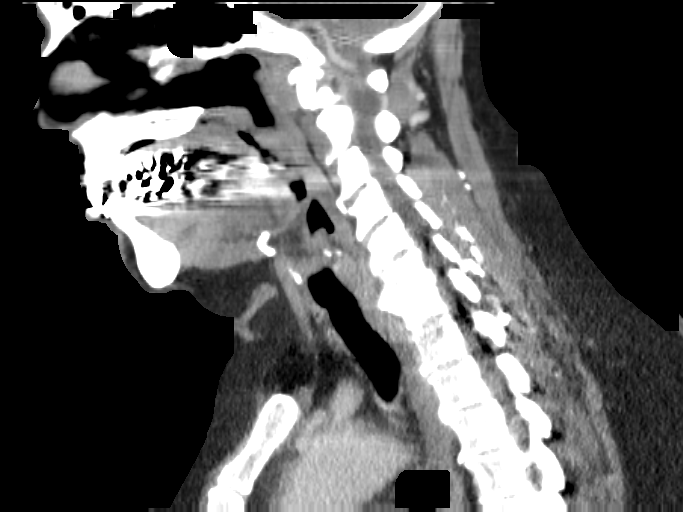
[im 80/159  bone]
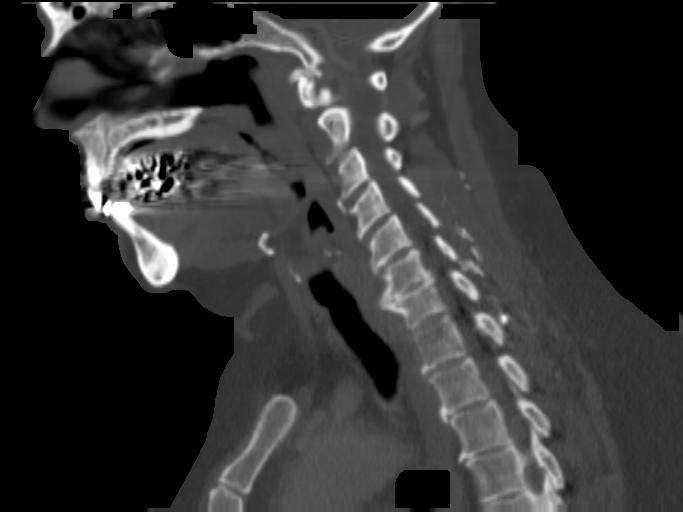
[im 93/159  bone]
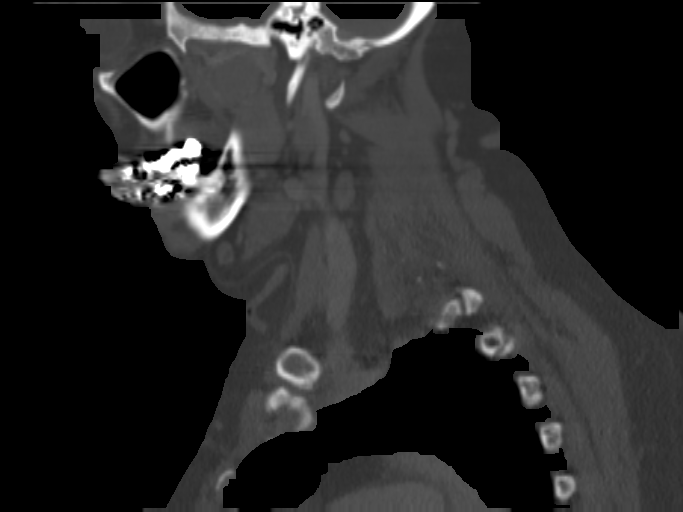
[im 106/159  bone]
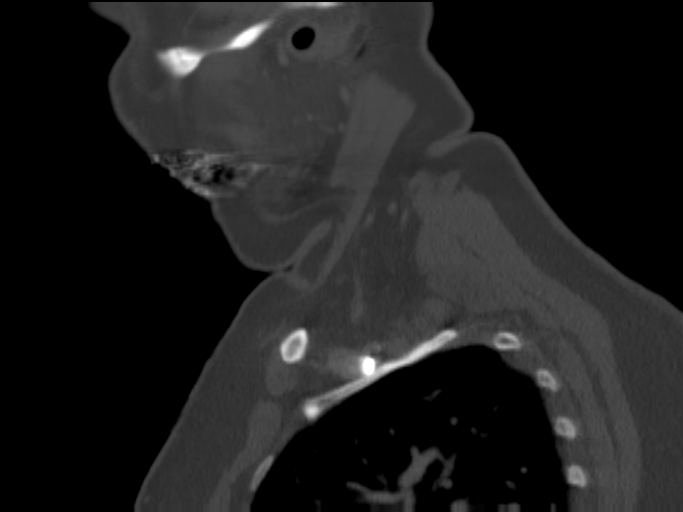

[Series 5: cor neck · coronal · 0.53mm/px · 3 of 112 slices shown]
[im 28/112  bone]
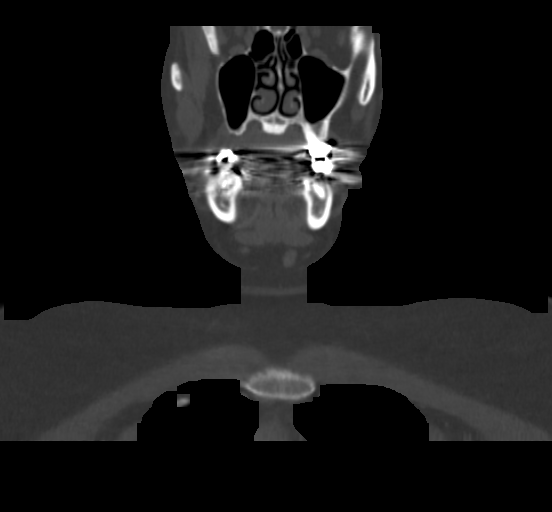
[im 47/112  bone]
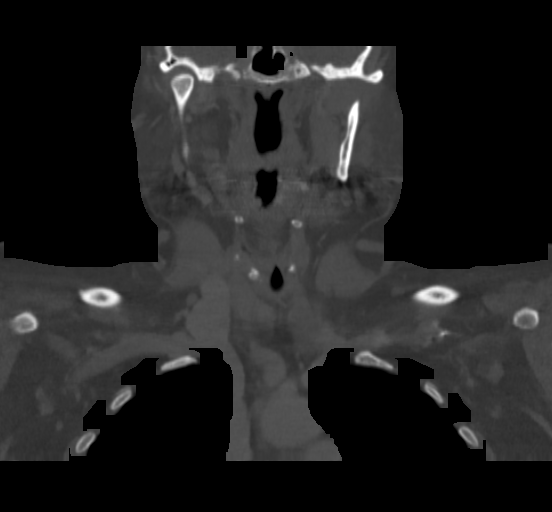
[im 65/112  bone]
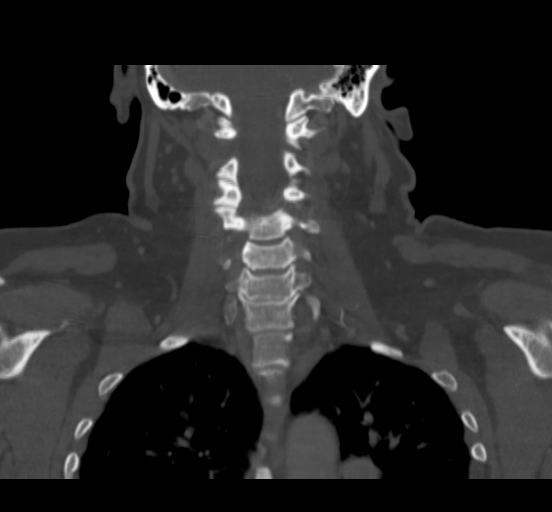

[Series 6: ax oropharynx · axial · 0.45mm/px · z∈[-433,-259]mm · 5 of 137 slices shown, 7 images]
[im 23/137  soft-tissue]
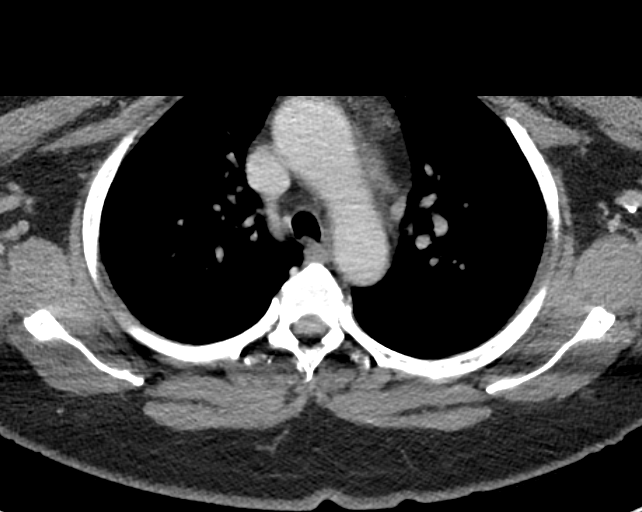
[im 23/137  bone]
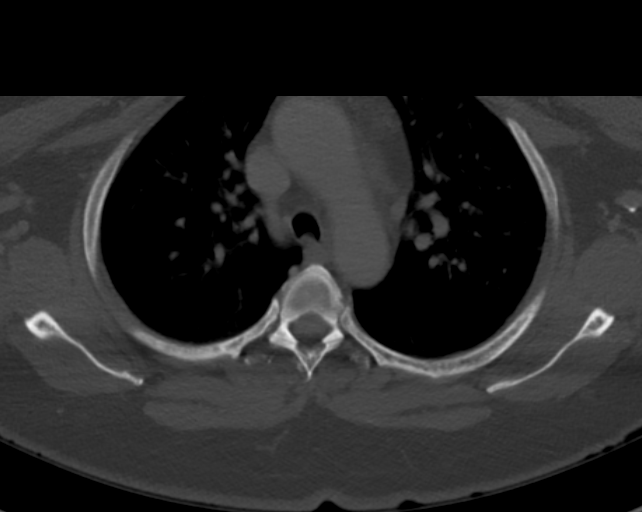
[im 46/137  bone]
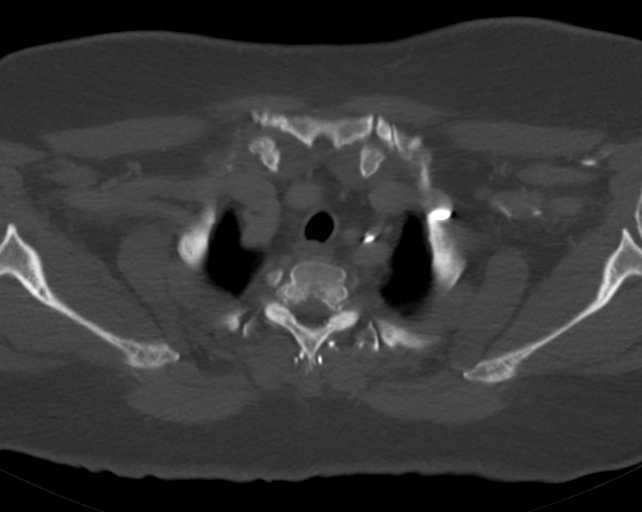
[im 69/137  bone]
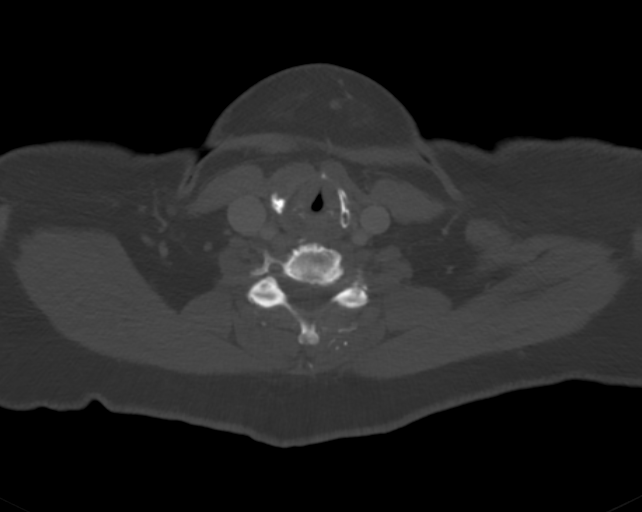
[im 91/137  bone]
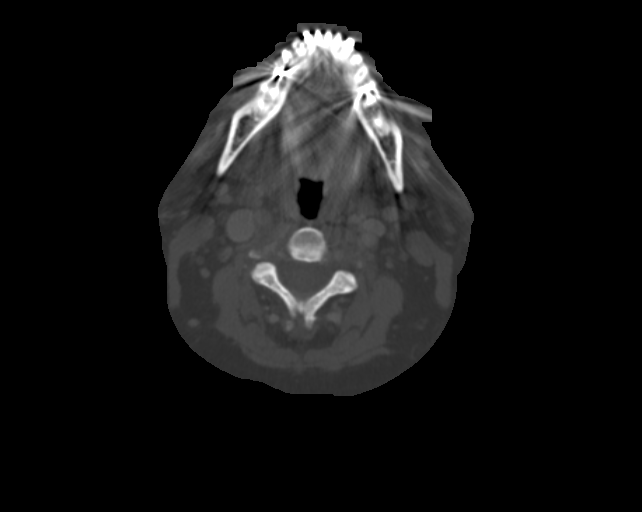
[im 114/137  soft-tissue]
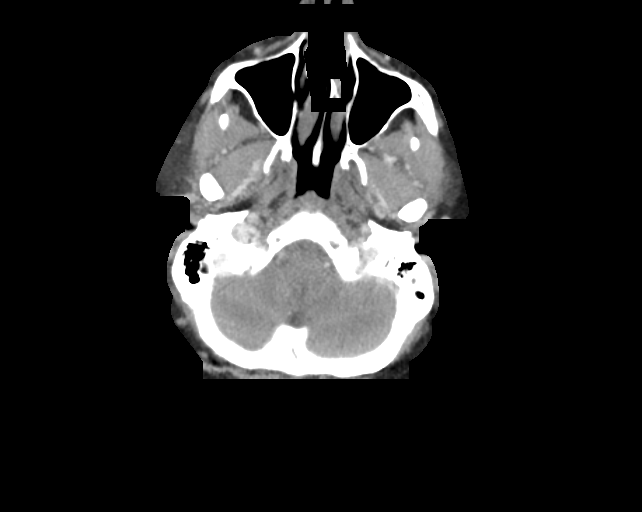
[im 114/137  bone]
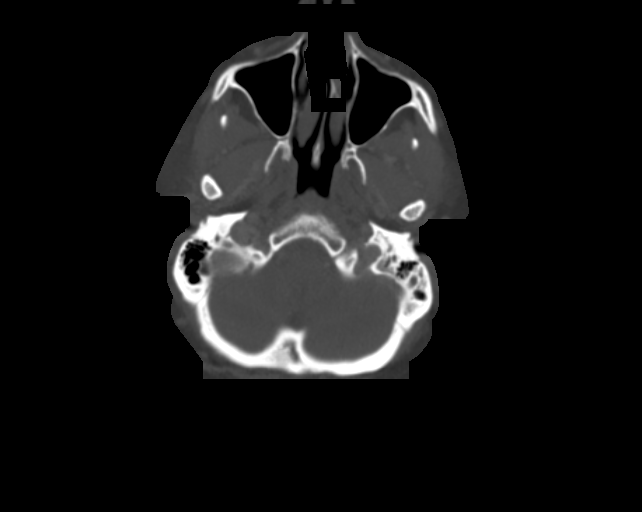

[13 of 33 positions shown; findings below may reference images not displayed]

FINDINGS: Pharynx and larynx: Normal.

Salivary glands: Normal.

Thyroid: Normal.

Lymph nodes: No pathologic adenopathy.

Vascular: Patent.

Limited intracranial: Normal.

Visualized orbits: Negative.

Mastoids and visualized paranasal sinuses: Negative.

Skeleton: Moderately advanced cervical spondylosis with severe disc
space narrowing at C6-C7.

Upper chest: No lung apex lesion.

There is no specific abnormality in the supraclavicular soft tissues
on the RIGHT versus LEFT which can be identified. Subcutaneous
adipose tissue appears symmetric. No discrete lipoma is evident.
IMPRESSION: No discrete abnormality is seen in the RIGHT medial supraclavicular
neck. Symmetric subcutaneous adipose tissue is observed.

No pathologic adenopathy is evident.

## 2015-09-12 ENCOUNTER — Ambulatory Visit
Admission: RE | Admit: 2015-09-12 | Discharge: 2015-09-12 | Disposition: A | Discharge status: Home / Self Care | Payer: Medicare Other | Source: Ambulatory Visit | Attending: Internal Medicine | Admitting: Internal Medicine

## 2015-09-12 DIAGNOSIS — Z1239 Encounter for other screening for malignant neoplasm of breast: Secondary | ICD-10-CM

## 2015-09-15 ENCOUNTER — Other Ambulatory Visit: Payer: Self-pay | Admitting: Internal Medicine

## 2015-09-15 ENCOUNTER — Ambulatory Visit
Admission: RE | Admit: 2015-09-15 | Discharge: 2015-09-15 | Disposition: A | Discharge status: Home / Self Care | Payer: Medicare Other | Source: Ambulatory Visit | Attending: Internal Medicine | Admitting: Internal Medicine

## 2015-09-15 DIAGNOSIS — Z1239 Encounter for other screening for malignant neoplasm of breast: Secondary | ICD-10-CM

## 2015-09-15 DIAGNOSIS — Z1231 Encounter for screening mammogram for malignant neoplasm of breast: Secondary | ICD-10-CM | POA: Diagnosis not present

## 2015-11-14 DIAGNOSIS — D489 Neoplasm of uncertain behavior, unspecified: Secondary | ICD-10-CM | POA: Diagnosis not present

## 2015-11-24 ENCOUNTER — Other Ambulatory Visit (INDEPENDENT_AMBULATORY_CARE_PROVIDER_SITE_OTHER): Payer: Medicare HMO

## 2015-11-24 DIAGNOSIS — I10 Essential (primary) hypertension: Secondary | ICD-10-CM | POA: Diagnosis not present

## 2015-11-24 DIAGNOSIS — R739 Hyperglycemia, unspecified: Secondary | ICD-10-CM | POA: Diagnosis not present

## 2015-11-24 DIAGNOSIS — E7800 Pure hypercholesterolemia, unspecified: Secondary | ICD-10-CM

## 2015-11-24 LAB — HEPATIC FUNCTION PANEL
ALK PHOS: 70 U/L (ref 39–117)
ALT: 12 U/L (ref 0–35)
AST: 15 U/L (ref 0–37)
Albumin: 4.2 g/dL (ref 3.5–5.2)
BILIRUBIN DIRECT: 0.2 mg/dL (ref 0.0–0.3)
BILIRUBIN TOTAL: 1.2 mg/dL (ref 0.2–1.2)
Total Protein: 7.2 g/dL (ref 6.0–8.3)

## 2015-11-24 LAB — CBC WITH DIFFERENTIAL/PLATELET
BASOS ABS: 0 10*3/uL (ref 0.0–0.1)
BASOS PCT: 0.6 % (ref 0.0–3.0)
EOS ABS: 0.2 10*3/uL (ref 0.0–0.7)
Eosinophils Relative: 3.4 % (ref 0.0–5.0)
HEMATOCRIT: 40.5 % (ref 36.0–46.0)
HEMOGLOBIN: 13.9 g/dL (ref 12.0–15.0)
LYMPHS PCT: 46 % (ref 12.0–46.0)
Lymphs Abs: 3.2 10*3/uL (ref 0.7–4.0)
MCHC: 34.3 g/dL (ref 30.0–36.0)
MCV: 86 fl (ref 78.0–100.0)
MONOS PCT: 8.8 % (ref 3.0–12.0)
Monocytes Absolute: 0.6 10*3/uL (ref 0.1–1.0)
NEUTROS ABS: 2.8 10*3/uL (ref 1.4–7.7)
NEUTROS PCT: 41.2 % — AB (ref 43.0–77.0)
PLATELETS: 281 10*3/uL (ref 150.0–400.0)
RBC: 4.72 Mil/uL (ref 3.87–5.11)
RDW: 12.8 % (ref 11.5–15.5)
WBC: 6.9 10*3/uL (ref 4.0–10.5)

## 2015-11-24 LAB — BASIC METABOLIC PANEL
BUN: 14 mg/dL (ref 6–23)
CALCIUM: 9.7 mg/dL (ref 8.4–10.5)
CO2: 31 meq/L (ref 19–32)
CREATININE: 0.86 mg/dL (ref 0.40–1.20)
Chloride: 103 mEq/L (ref 96–112)
GFR: 69.66 mL/min (ref >60.00)
GLUCOSE: 100 mg/dL — AB (ref 70–99)
Potassium: 4.1 mEq/L (ref 3.5–5.1)
Sodium: 140 mEq/L (ref 135–145)

## 2015-11-24 LAB — LIPID PANEL
CHOLESTEROL: 176 mg/dL (ref 0–200)
HDL: 50.4 mg/dL (ref >39.00)
LDL Cholesterol: 110 mg/dL — ABNORMAL HIGH (ref 0–99)
NonHDL: 125.36
Total CHOL/HDL Ratio: 3
Triglycerides: 77 mg/dL (ref 0.0–149.0)
VLDL: 15.4 mg/dL (ref 0.0–40.0)

## 2015-11-24 LAB — HEMOGLOBIN A1C: Hgb A1c MFr Bld: 5.7 % (ref 4.6–6.5)

## 2015-11-25 ENCOUNTER — Encounter: Payer: Self-pay | Admitting: Internal Medicine

## 2015-11-28 ENCOUNTER — Ambulatory Visit (INDEPENDENT_AMBULATORY_CARE_PROVIDER_SITE_OTHER): Payer: Medicare HMO | Admitting: Internal Medicine

## 2015-11-28 ENCOUNTER — Encounter: Payer: Self-pay | Admitting: Internal Medicine

## 2015-11-28 VITALS — BP 130/78 | HR 69 | Temp 97.9°F | Resp 18 | Ht 63.0 in | Wt 193.5 lb

## 2015-11-28 DIAGNOSIS — R221 Localized swelling, mass and lump, neck: Secondary | ICD-10-CM

## 2015-11-28 DIAGNOSIS — E7800 Pure hypercholesterolemia, unspecified: Secondary | ICD-10-CM

## 2015-11-28 DIAGNOSIS — R739 Hyperglycemia, unspecified: Secondary | ICD-10-CM

## 2015-11-28 DIAGNOSIS — Z8542 Personal history of malignant neoplasm of other parts of uterus: Secondary | ICD-10-CM

## 2015-11-28 DIAGNOSIS — R945 Abnormal results of liver function studies: Secondary | ICD-10-CM

## 2015-11-28 DIAGNOSIS — I10 Essential (primary) hypertension: Secondary | ICD-10-CM | POA: Diagnosis not present

## 2015-11-28 DIAGNOSIS — R7989 Other specified abnormal findings of blood chemistry: Secondary | ICD-10-CM

## 2015-11-28 DIAGNOSIS — E669 Obesity, unspecified: Secondary | ICD-10-CM | POA: Diagnosis not present

## 2015-11-28 MED ORDER — AZITHROMYCIN 250 MG PO TABS: ORAL_TABLET | ORAL | Status: DC

## 2015-11-28 MED ORDER — PRAVASTATIN SODIUM 10 MG PO TABS: ORAL_TABLET | ORAL | Status: DC

## 2015-11-28 MED ORDER — IRBESARTAN-HYDROCHLOROTHIAZIDE 150-12.5 MG PO TABS: 1.0000 | ORAL_TABLET | Freq: Every day | ORAL | Status: DC

## 2015-11-28 NOTE — Progress Notes (Signed)
Pre-visit discussion using our clinic review tool. No additional management support is needed unless otherwise documented below in the visit note.  

## 2015-11-28 NOTE — Progress Notes (Signed)
Patient ID: Barbara Burns, female   DOB: July 10, 1946, 70 y.o.   MRN: [id number]   Subjective:    Patient ID: Barbara Burns, female    DOB: 06-20-1946, 70 y.o.   MRN: [id number]  HPI  Patient with past history of hypercholesterolemia, GERD, endometrial cancer and hypercholesterolemia.  She comes in today to follow up on these issues.  She is doing well.  Has been traveling.  Feels good.  Has not been watching her diet as well.  Is getting back to monitoring now.  Is exercising.  No chest pain or tightness with increased activity or exertion.  No sob.  No acid reflux.  No abdominal pain or cramping.  Bowels stable.  Seeing gyn. Up to date with tetanus.  Had 2011.     Past Medical History  Diagnosis Date  . Hypertension   . Hypercholesterolemia   . GERD (gastroesophageal reflux disease)   . Chicken pox   . Endometrial cancer [facility])     s/p hysterectomy - 2003   Past Surgical History  Procedure Laterality Date  . Abdominal hysterectomy      total, may 2003  . Appendectomy      age 78  . Oophorectomy      left, secondary to ovarian cyst  . Colonoscopy with propofol N/A 05/20/2015    Procedure: COLONOSCOPY WITH PROPOFOL;  Surgeon: Manya Silvas, MD;  Location: [facility] ENDOSCOPY;  Service: Endoscopy;  Laterality: N/A;   Family History  Problem Relation Age of Onset  . Sleep apnea Father     resulting right heart failure  . Heart disease Father   . Hypertension Father   . Stroke Maternal Grandmother   . Alcohol abuse Maternal Grandfather   . Diabetes Paternal Grandmother   . Lymphoma Mother     non hodgkins lymphoma   Social History   Social History  . Marital Status: Married    Spouse Name: N/A  . Number of Children: N/A  . Years of Education: N/A   Social History Main Topics  . Smoking status: Former Research scientist (life sciences)  . Smokeless tobacco: Never Used  . Alcohol Use: No  . Drug Use: No  . Sexual Activity: No   Other Topics Concern  . None   Social History Narrative     Outpatient Encounter Prescriptions as of 11/28/2015  Medication Sig  . aspirin 81 MG tablet Take 81 mg by mouth daily.  . Calcium Carbonate (CALCIUM 600 PO) Take 1 tablet by mouth 2 (two) times daily.  . Cholecalciferol (VITAMIN D3) 2000 UNITS TABS Take 2,000 Units by mouth daily.  Marland Kitchen co-enzyme Q-10 50 MG capsule Take 50 mg by mouth daily.  . fish oil-omega-3 fatty acids 1000 MG capsule Take 1 g by mouth daily.  . irbesartan-hydrochlorothiazide (AVALIDE) 150-12.5 MG tablet Take 1 tablet by mouth daily.  Marland Kitchen omeprazole (PRILOSEC) 20 MG capsule Take 20 mg by mouth daily.  . pravastatin (PRAVACHOL) 10 MG tablet TAKE 1 TABLET (10 MG TOTAL) BY MOUTH DAILY.  . [DISCONTINUED] hydrochlorothiazide (MICROZIDE) 12.5 MG capsule Take 1 capsule (12.5 mg total) by mouth daily.  . [DISCONTINUED] irbesartan-hydrochlorothiazide (AVALIDE) 150-12.5 MG tablet Take 1 tablet by mouth daily.  . [DISCONTINUED] pravastatin (PRAVACHOL) 10 MG tablet TAKE 1 TABLET (10 MG TOTAL) BY MOUTH DAILY.  Marland Kitchen azithromycin (ZITHROMAX) 250 MG tablet Take two tablets x 1 day and then one per day for four more days.   No facility-administered encounter medications on file as of 11/28/2015.  Review of Systems  Constitutional: Negative for appetite change and unexpected weight change.  HENT: Negative for congestion and sinus pressure.   Respiratory: Negative for cough, chest tightness and shortness of breath.   Cardiovascular: Negative for chest pain, palpitations and leg swelling.  Gastrointestinal: Negative for nausea, vomiting, abdominal pain and diarrhea.  Genitourinary: Negative for dysuria and difficulty urinating.  Musculoskeletal: Negative for back pain and joint swelling.  Skin: Negative for color change and rash.  Neurological: Negative for dizziness, light-headedness and headaches.  Psychiatric/Behavioral: Negative for dysphoric mood and agitation.       Objective:    Physical Exam  Constitutional: She appears  well-developed and well-nourished. No distress.  HENT:  Nose: Nose normal.  Mouth/Throat: Oropharynx is clear and moist.  Eyes: Right eye exhibits no discharge. Left eye exhibits no discharge.  Neck: Neck supple. No thyromegaly present.  Cardiovascular: Normal rate and regular rhythm.   Pulmonary/Chest: Breath sounds normal. No respiratory distress. She has no wheezes.  Abdominal: Soft. Bowel sounds are normal. There is no tenderness.  Musculoskeletal: She exhibits no edema or tenderness.  Lymphadenopathy:    She has no cervical adenopathy.  Skin: No rash noted. No erythema.  Psychiatric: She has a normal mood and affect. Her behavior is normal.    BP 130/78 mmHg  Pulse 69  Temp(Src) 97.9 F (36.6 C) (Oral)  Resp 18  Ht 5' 3"  (1.6 m)  Wt 193 lb 8 oz (87.771 kg)  BMI 34.29 kg/m2  SpO2 97% Wt Readings from Last 3 Encounters:  11/28/15 193 lb 8 oz (87.771 kg)  08/29/15 189 lb (85.73 kg)  05/20/15 189 lb (85.73 kg)     Lab Results  Component Value Date   WBC 6.9 11/24/2015   HGB 13.9 11/24/2015   HCT 40.5 11/24/2015   PLT 281.0 11/24/2015   GLUCOSE 100* 11/24/2015   CHOL 176 11/24/2015   TRIG 77.0 11/24/2015   HDL 50.40 11/24/2015   LDLDIRECT 154.1 08/11/2013   LDLCALC 110* 11/24/2015   ALT 12 11/24/2015   AST 15 11/24/2015   NA 140 11/24/2015   K 4.1 11/24/2015   CL 103 11/24/2015   CREATININE 0.86 11/24/2015   BUN 14 11/24/2015   CO2 31 11/24/2015   TSH 3.88 08/26/2015   HGBA1C 5.7 11/24/2015   MICROALBUR <0.7 02/23/2015    Mm Screening Breast Tomo Bilateral  09/15/2015  CLINICAL DATA:  Screening. EXAM: DIGITAL SCREENING BILATERAL MAMMOGRAM WITH 3D TOMO WITH CAD COMPARISON:  Previous exam(s). ACR Breast Density Category b: There are scattered areas of fibroglandular density. FINDINGS: There are no findings suspicious for malignancy. Images were processed with CAD. IMPRESSION: No mammographic evidence of malignancy. A result letter of this screening mammogram  will be mailed directly to the patient. RECOMMENDATION: Screening mammogram in one year. (Code:SM-B-01Y) BI-RADS CATEGORY  1: Negative. Electronically Signed   By: [name] M.D.   On: 09/15/2015 09:58       Assessment & Plan:   Problem List Items Addressed This Visit    Abnormal liver function test    Recent liver panel wnl.        History of endometrial cancer    Follow by Dr Delsa Sale.  Last evaluated 12/2014.  Continue yearly follow up.        Hypercholesterolemia    Low cholesterol diet and exercise.  Follow lipid panel and liver function tests.  On pravastatin.   Lab Results  Component Value Date   CHOL 176 11/24/2015  HDL 50.40 11/24/2015   LDLCALC 110* 11/24/2015   LDLDIRECT 154.1 08/11/2013   TRIG 77.0 11/24/2015   CHOLHDL 3 11/24/2015         Relevant Medications   irbesartan-hydrochlorothiazide (AVALIDE) 150-12.5 MG tablet   pravastatin (PRAVACHOL) 10 MG tablet   Other Relevant Orders   Lipid panel   Hepatic function panel   Hyperglycemia    Low carb diet and exercise.  Follow met b and a1c.   Lab Results  Component Value Date   HGBA1C 5.7 11/24/2015        Relevant Orders   Hemoglobin A1c   Hypertension - Primary    Blood pressure has been doing well.  Continue same medication regimen.  Follow pressures.  Follow metabolic panel.        Relevant Medications   irbesartan-hydrochlorothiazide (AVALIDE) 150-12.5 MG tablet   pravastatin (PRAVACHOL) 10 MG tablet   Other Relevant Orders   Basic metabolic panel   Neck fullness    Saw ENT.  Released.  See their note.       Obesity (BMI 30-39.9)    Discussed diet and exercise.            Einar Pheasant, MD

## 2015-11-29 ENCOUNTER — Encounter: Payer: Self-pay | Admitting: Internal Medicine

## 2015-11-29 NOTE — Assessment & Plan Note (Signed)
Saw ENT.  Released.  See their note.

## 2015-11-29 NOTE — Assessment & Plan Note (Signed)
Recent liver panel wnl.  

## 2015-11-29 NOTE — Assessment & Plan Note (Signed)
Discussed diet and exercise 

## 2015-11-29 NOTE — Assessment & Plan Note (Signed)
Follow by Dr Delsa Sale.  Last evaluated 12/2014.  Continue yearly follow up.

## 2015-11-29 NOTE — Assessment & Plan Note (Signed)
Blood pressure has been doing well.  Continue same medication regimen.  Follow pressures.  Follow metabolic panel.  

## 2015-11-29 NOTE — Assessment & Plan Note (Signed)
Low cholesterol diet and exercise.  Follow lipid panel and liver function tests.  On pravastatin.   Lab Results  Component Value Date   CHOL 176 11/24/2015   HDL 50.40 11/24/2015   LDLCALC 110* 11/24/2015   LDLDIRECT 154.1 08/11/2013   TRIG 77.0 11/24/2015   CHOLHDL 3 11/24/2015

## 2015-11-29 NOTE — Assessment & Plan Note (Signed)
Low carb diet and exercise.  Follow met b and a1c.   Lab Results  Component Value Date   HGBA1C 5.7 11/24/2015

## 2016-01-30 ENCOUNTER — Other Ambulatory Visit: Payer: Self-pay | Admitting: Internal Medicine

## 2016-03-19 ENCOUNTER — Other Ambulatory Visit (INDEPENDENT_AMBULATORY_CARE_PROVIDER_SITE_OTHER): Payer: Medicare HMO

## 2016-03-19 DIAGNOSIS — R739 Hyperglycemia, unspecified: Secondary | ICD-10-CM

## 2016-03-19 DIAGNOSIS — E7800 Pure hypercholesterolemia, unspecified: Secondary | ICD-10-CM

## 2016-03-19 DIAGNOSIS — I10 Essential (primary) hypertension: Secondary | ICD-10-CM | POA: Diagnosis not present

## 2016-03-19 LAB — BASIC METABOLIC PANEL
BUN: 16 mg/dL (ref 6–23)
CALCIUM: 9.7 mg/dL (ref 8.4–10.5)
CHLORIDE: 103 meq/L (ref 96–112)
CO2: 29 meq/L (ref 19–32)
Creatinine, Ser: 0.85 mg/dL (ref 0.40–1.20)
GFR: 70.54 mL/min (ref >60.00)
GLUCOSE: 119 mg/dL — AB (ref 70–99)
Potassium: 3.9 mEq/L (ref 3.5–5.1)
SODIUM: 139 meq/L (ref 135–145)

## 2016-03-19 LAB — HEPATIC FUNCTION PANEL
ALT: 13 U/L (ref 0–35)
AST: 12 U/L (ref 0–37)
Albumin: 4.1 g/dL (ref 3.5–5.2)
Alkaline Phosphatase: 70 U/L (ref 39–117)
BILIRUBIN DIRECT: 0.1 mg/dL (ref 0.0–0.3)
BILIRUBIN TOTAL: 1 mg/dL (ref 0.2–1.2)
Total Protein: 7 g/dL (ref 6.0–8.3)

## 2016-03-19 LAB — LIPID PANEL
CHOLESTEROL: 185 mg/dL (ref 0–200)
HDL: 46.8 mg/dL (ref >39.00)
LDL Cholesterol: 114 mg/dL — ABNORMAL HIGH (ref 0–99)
NONHDL: 138.27
Total CHOL/HDL Ratio: 4
Triglycerides: 119 mg/dL (ref 0.0–149.0)
VLDL: 23.8 mg/dL (ref 0.0–40.0)

## 2016-03-19 LAB — HEMOGLOBIN A1C: HEMOGLOBIN A1C: 5.7 % (ref 4.6–6.5)

## 2016-03-20 ENCOUNTER — Encounter: Payer: Self-pay | Admitting: Internal Medicine

## 2016-03-26 ENCOUNTER — Ambulatory Visit (INDEPENDENT_AMBULATORY_CARE_PROVIDER_SITE_OTHER): Payer: Medicare HMO | Admitting: Internal Medicine

## 2016-03-26 ENCOUNTER — Encounter: Payer: Self-pay | Admitting: Internal Medicine

## 2016-03-26 VITALS — BP 120/70 | HR 67 | Temp 97.9°F | Resp 18 | Ht 63.0 in | Wt 192.5 lb

## 2016-03-26 DIAGNOSIS — I10 Essential (primary) hypertension: Secondary | ICD-10-CM

## 2016-03-26 DIAGNOSIS — Z8542 Personal history of malignant neoplasm of other parts of uterus: Secondary | ICD-10-CM | POA: Diagnosis not present

## 2016-03-26 DIAGNOSIS — R945 Abnormal results of liver function studies: Secondary | ICD-10-CM

## 2016-03-26 DIAGNOSIS — R739 Hyperglycemia, unspecified: Secondary | ICD-10-CM

## 2016-03-26 DIAGNOSIS — E669 Obesity, unspecified: Secondary | ICD-10-CM

## 2016-03-26 DIAGNOSIS — E7800 Pure hypercholesterolemia, unspecified: Secondary | ICD-10-CM

## 2016-03-26 DIAGNOSIS — R7989 Other specified abnormal findings of blood chemistry: Secondary | ICD-10-CM

## 2016-03-26 MED ORDER — HYDROCHLOROTHIAZIDE 12.5 MG PO CAPS
12.5000 mg | ORAL_CAPSULE | Freq: Every day | ORAL | Status: DC
Start: 2016-03-26 — End: 2016-10-22

## 2016-03-26 NOTE — Progress Notes (Signed)
Pre-visit discussion using our clinic review tool. No additional management support is needed unless otherwise documented below in the visit note.  

## 2016-03-26 NOTE — Progress Notes (Signed)
Patient ID: Barbara Burns, female   DOB: 1946/07/02, 70 y.o.   MRN: [id number]   Subjective:    Patient ID: Barbara Burns, female    DOB: 1946-02-03, 70 y.o.   MRN: [id number]  HPI  Patient here for a scheduled follow up.  She is doing well.  Feels good.  Just returned from a trip to [place].  Tries to stay active.  No cardiac symptoms with increased activity or exertion.  No sob.  No acid reflux.  No abdominal pain or cramping.  Bowels stable.  States prior to going on her trip, her blood pressure was elevated.  She started back on 12.73m of HCTZ.  Blood pressure doing well now averaging 120/70.  She does report that yesterday noticed some pain in her left great toe.  No injury.  No redness.  No swelling.  Better today.  Desires no further intervention.  Discussed lab results.     Past Medical History  Diagnosis Date  . Hypertension   . Hypercholesterolemia   . GERD (gastroesophageal reflux disease)   . Chicken pox   . Endometrial cancer ([facility]     s/p hysterectomy - 2003   Past Surgical History  Procedure Laterality Date  . Abdominal hysterectomy      total, may 2003  . Appendectomy      age 70 . Oophorectomy      left, secondary to ovarian cyst  . Colonoscopy with propofol N/A 05/20/2015    Procedure: COLONOSCOPY WITH PROPOFOL;  Surgeon: RManya Silvas MD;  [facility];  Service: Endoscopy;  Laterality: N/A;   Family History  Problem Relation Age of Onset  . Sleep apnea Father     resulting right heart failure  . Heart disease Father   . Hypertension Father   . Stroke Maternal Grandmother   . Alcohol abuse Maternal Grandfather   . Diabetes Paternal Grandmother   . Lymphoma Mother     non hodgkins lymphoma   Social History   Social History  . Marital Status: Married    Spouse Name: N/A  . Number of Children: N/A  . Years of Education: N/A   Social History Main Topics  . Smoking status: Former SResearch scientist (life sciences) . Smokeless tobacco: Never Used  . Alcohol  Use: No  . Drug Use: No  . Sexual Activity: No   Other Topics Concern  . None   Social History Narrative    Outpatient Encounter Prescriptions as of 03/26/2016  Medication Sig  . aspirin 81 MG tablet Take 81 mg by mouth daily.  . Calcium Carbonate (CALCIUM 600 PO) Take 1 tablet by mouth 2 (two) times daily.  . Cholecalciferol (VITAMIN D3) 2000 UNITS TABS Take 2,000 Units by mouth daily.  .Marland Kitchenco-enzyme Q-10 50 MG capsule Take 50 mg by mouth daily.  . fish oil-omega-3 fatty acids 1000 MG capsule Take 1 g by mouth daily.  . hydrochlorothiazide (MICROZIDE) 12.5 MG capsule Take 1 capsule (12.5 mg total) by mouth daily.  . irbesartan-hydrochlorothiazide (AVALIDE) 150-12.5 MG tablet Take 1 tablet by mouth daily.  .Marland Kitchenomeprazole (PRILOSEC) 20 MG capsule Take 20 mg by mouth daily.  . pravastatin (PRAVACHOL) 10 MG tablet TAKE 1 TABLET (10 MG TOTAL) BY MOUTH DAILY.  . [DISCONTINUED] azithromycin (ZITHROMAX) 250 MG tablet Take two tablets x 1 day and then one per day for four more days.  . [DISCONTINUED] hydrochlorothiazide (MICROZIDE) 12.5 MG capsule Take 12.5 mg by mouth daily.  . [DISCONTINUED] irbesartan-hydrochlorothiazide (AVALIDE)  150-12.5 MG tablet TAKE 1 TABLET BY MOUTH DAILY.   No facility-administered encounter medications on file as of 03/26/2016.    Review of Systems  Constitutional: Negative for appetite change and unexpected weight change.  HENT: Negative for congestion and sinus pressure.   Respiratory: Negative for cough, chest tightness and shortness of breath.   Cardiovascular: Negative for chest pain, palpitations and leg swelling.  Gastrointestinal: Negative for nausea, vomiting, abdominal pain and diarrhea.  Genitourinary: Negative for dysuria and difficulty urinating.  Musculoskeletal: Negative for back pain and joint swelling.       Left great toe pain as outlined.   Skin: Negative for color change and rash.  Neurological: Negative for dizziness, light-headedness and  headaches.  Psychiatric/Behavioral: Negative for dysphoric mood and agitation.       Objective:    Physical Exam  Constitutional: She appears well-developed and well-nourished. No distress.  HENT:  Nose: Nose normal.  Mouth/Throat: Oropharynx is clear and moist.  Neck: Neck supple. No thyromegaly present.  Cardiovascular: Normal rate and regular rhythm.   Pulmonary/Chest: Breath sounds normal. No respiratory distress. She has no wheezes.  Abdominal: Soft. Bowel sounds are normal. There is no tenderness.  Musculoskeletal: She exhibits no edema or tenderness.  No significant pain to palpation over the left great toe.  Minimal pain - left DIP.  No increased erythema.  No swelling.    Lymphadenopathy:    She has no cervical adenopathy.  Skin: No rash noted. No erythema.  Psychiatric: She has a normal mood and affect. Her behavior is normal.    BP 120/70 mmHg  Pulse 67  Temp(Src) 97.9 F (36.6 C) (Oral)  Resp 18  Ht 5' 3"  (1.6 m)  Wt 192 lb 8 oz (87.317 kg)  BMI 34.11 kg/m2  SpO2 98% Wt Readings from Last 3 Encounters:  03/26/16 192 lb 8 oz (87.317 kg)  11/28/15 193 lb 8 oz (87.771 kg)  08/29/15 189 lb (85.73 kg)     Lab Results  Component Value Date   WBC 6.9 11/24/2015   HGB 13.9 11/24/2015   HCT 40.5 11/24/2015   PLT 281.0 11/24/2015   GLUCOSE 119* 03/19/2016   CHOL 185 03/19/2016   TRIG 119.0 03/19/2016   HDL 46.80 03/19/2016   LDLDIRECT 154.1 08/11/2013   LDLCALC 114* 03/19/2016   ALT 13 03/19/2016   AST 12 03/19/2016   NA 139 03/19/2016   K 3.9 03/19/2016   CL 103 03/19/2016   CREATININE 0.85 03/19/2016   BUN 16 03/19/2016   CO2 29 03/19/2016   TSH 3.88 08/26/2015   HGBA1C 5.7 03/19/2016   MICROALBUR <0.7 02/23/2015    Mm Screening Breast Tomo Bilateral  09/15/2015  CLINICAL DATA:  Screening. EXAM: DIGITAL SCREENING BILATERAL MAMMOGRAM WITH 3D TOMO WITH CAD COMPARISON:  Previous exam(s). ACR Breast Density Category b: There are scattered areas of  fibroglandular density. FINDINGS: There are no findings suspicious for malignancy. Images were processed with CAD. IMPRESSION: No mammographic evidence of malignancy. A result letter of this screening mammogram will be mailed directly to the patient. RECOMMENDATION: Screening mammogram in one year. (Code:SM-B-01Y) BI-RADS CATEGORY  1: Negative. Electronically Signed   By: [name] M.D.   On: 09/15/2015 09:58       Assessment & Plan:   Problem List Items Addressed This Visit    Abnormal liver function test    Last liver panel wnl.  Follow.       History of endometrial cancer - Primary  Followed by Dr Delsa Sale.        Hypercholesterolemia    Low cholesterol diet and exercise.  LDL 114.  Follow lipid panel.        Relevant Medications   hydrochlorothiazide (MICROZIDE) 12.5 MG capsule   Other Relevant Orders   Lipid panel   Hepatic function panel   Hyperglycemia    Low carb diet and exercise.  Follow met b and a1c.   Lab Results  Component Value Date   HGBA1C 5.7 03/19/2016        Relevant Orders   Hemoglobin A1c   Hypertension    Back on the additional HCTZ.  Blood pressure doing well.  Follow pressure.  Follow metabolic panel.        Relevant Medications   hydrochlorothiazide (MICROZIDE) 12.5 MG capsule   Other Relevant Orders   Basic metabolic panel   Obesity (BMI 30-39.9)    Diet and exercise.            Einar Pheasant, MD

## 2016-03-27 ENCOUNTER — Encounter: Payer: Self-pay | Admitting: Internal Medicine

## 2016-03-27 NOTE — Assessment & Plan Note (Signed)
Followed by Dr Delsa Sale.

## 2016-03-27 NOTE — Assessment & Plan Note (Signed)
Back on the additional HCTZ.  Blood pressure doing well.  Follow pressure.  Follow metabolic panel.

## 2016-03-27 NOTE — Assessment & Plan Note (Signed)
Low carb diet and exercise.  Follow met b and a1c.   Lab Results  Component Value Date   HGBA1C 5.7 03/19/2016

## 2016-03-27 NOTE — Assessment & Plan Note (Signed)
Low cholesterol diet and exercise.  LDL 114.  Follow lipid panel.

## 2016-03-27 NOTE — Assessment & Plan Note (Signed)
Diet and exercise.   

## 2016-03-27 NOTE — Assessment & Plan Note (Signed)
Last liver panel wnl.  Follow.  

## 2016-03-30 ENCOUNTER — Telehealth: Payer: Self-pay

## 2016-03-30 NOTE — Telephone Encounter (Signed)
Patient is on the list for [employer] 2017 and may be a good candidate for an AWV in 2017. Please let me know if/when appt is scheduled.   

## 2016-04-02 NOTE — Telephone Encounter (Signed)
Left a message for the patient to call me back.

## 2016-04-03 NOTE — Telephone Encounter (Signed)
Appointment scheduled after upcoming follow up with PCP.

## 2016-04-16 DIAGNOSIS — Z01419 Encounter for gynecological examination (general) (routine) without abnormal findings: Secondary | ICD-10-CM | POA: Diagnosis not present

## 2016-04-16 DIAGNOSIS — Z8542 Personal history of malignant neoplasm of other parts of uterus: Secondary | ICD-10-CM | POA: Diagnosis not present

## 2016-05-08 ENCOUNTER — Encounter: Payer: Self-pay | Admitting: Internal Medicine

## 2016-06-21 DIAGNOSIS — Z0100 Encounter for examination of eyes and vision without abnormal findings: Secondary | ICD-10-CM | POA: Diagnosis not present

## 2016-07-26 ENCOUNTER — Other Ambulatory Visit (INDEPENDENT_AMBULATORY_CARE_PROVIDER_SITE_OTHER): Payer: Medicare HMO

## 2016-07-26 DIAGNOSIS — R739 Hyperglycemia, unspecified: Secondary | ICD-10-CM | POA: Diagnosis not present

## 2016-07-26 DIAGNOSIS — E7800 Pure hypercholesterolemia, unspecified: Secondary | ICD-10-CM

## 2016-07-26 DIAGNOSIS — I10 Essential (primary) hypertension: Secondary | ICD-10-CM | POA: Diagnosis not present

## 2016-07-26 LAB — BASIC METABOLIC PANEL
BUN: 15 mg/dL (ref 6–23)
CHLORIDE: 103 meq/L (ref 96–112)
CO2: 31 meq/L (ref 19–32)
CREATININE: 0.85 mg/dL (ref 0.40–1.20)
Calcium: 9.8 mg/dL (ref 8.4–10.5)
GFR: 70.47 mL/min (ref >60.00)
Glucose, Bld: 112 mg/dL — ABNORMAL HIGH (ref 70–99)
POTASSIUM: 3.9 meq/L (ref 3.5–5.1)
Sodium: 140 mEq/L (ref 135–145)

## 2016-07-26 LAB — HEPATIC FUNCTION PANEL
ALBUMIN: 4.3 g/dL (ref 3.5–5.2)
ALK PHOS: 71 U/L (ref 39–117)
ALT: 13 U/L (ref 0–35)
AST: 14 U/L (ref 0–37)
BILIRUBIN TOTAL: 1 mg/dL (ref 0.2–1.2)
Bilirubin, Direct: 0.1 mg/dL (ref 0.0–0.3)
Total Protein: 7.4 g/dL (ref 6.0–8.3)

## 2016-07-26 LAB — LIPID PANEL
CHOL/HDL RATIO: 4
Cholesterol: 184 mg/dL (ref 0–200)
HDL: 52.1 mg/dL (ref >39.00)
LDL Cholesterol: 108 mg/dL — ABNORMAL HIGH (ref 0–99)
NONHDL: 132.2
Triglycerides: 119 mg/dL (ref 0.0–149.0)
VLDL: 23.8 mg/dL (ref 0.0–40.0)

## 2016-07-26 LAB — HEMOGLOBIN A1C: Hgb A1c MFr Bld: 5.8 % (ref 4.6–6.5)

## 2016-07-30 ENCOUNTER — Encounter: Payer: Self-pay | Admitting: Internal Medicine

## 2016-07-30 ENCOUNTER — Ambulatory Visit (INDEPENDENT_AMBULATORY_CARE_PROVIDER_SITE_OTHER): Payer: Medicare HMO | Admitting: Internal Medicine

## 2016-07-30 VITALS — BP 110/60 | HR 61 | Temp 98.0°F | Ht 63.0 in | Wt 197.2 lb

## 2016-07-30 DIAGNOSIS — R195 Other fecal abnormalities: Secondary | ICD-10-CM

## 2016-07-30 DIAGNOSIS — R7989 Other specified abnormal findings of blood chemistry: Secondary | ICD-10-CM

## 2016-07-30 DIAGNOSIS — Z8542 Personal history of malignant neoplasm of other parts of uterus: Secondary | ICD-10-CM

## 2016-07-30 DIAGNOSIS — Z1239 Encounter for other screening for malignant neoplasm of breast: Secondary | ICD-10-CM

## 2016-07-30 DIAGNOSIS — Z1231 Encounter for screening mammogram for malignant neoplasm of breast: Secondary | ICD-10-CM | POA: Diagnosis not present

## 2016-07-30 DIAGNOSIS — R739 Hyperglycemia, unspecified: Secondary | ICD-10-CM

## 2016-07-30 DIAGNOSIS — R197 Diarrhea, unspecified: Secondary | ICD-10-CM

## 2016-07-30 DIAGNOSIS — E669 Obesity, unspecified: Secondary | ICD-10-CM | POA: Diagnosis not present

## 2016-07-30 DIAGNOSIS — R945 Abnormal results of liver function studies: Secondary | ICD-10-CM

## 2016-07-30 DIAGNOSIS — E7800 Pure hypercholesterolemia, unspecified: Secondary | ICD-10-CM

## 2016-07-30 DIAGNOSIS — G4730 Sleep apnea, unspecified: Secondary | ICD-10-CM

## 2016-07-30 DIAGNOSIS — I10 Essential (primary) hypertension: Secondary | ICD-10-CM

## 2016-07-30 DIAGNOSIS — D649 Anemia, unspecified: Secondary | ICD-10-CM

## 2016-07-30 DIAGNOSIS — E559 Vitamin D deficiency, unspecified: Secondary | ICD-10-CM

## 2016-07-30 NOTE — Progress Notes (Signed)
Pre visit review using our clinic review tool, if applicable. No additional management support is needed unless otherwise documented below in the visit note. 

## 2016-07-30 NOTE — Patient Instructions (Signed)
Probiotic daily - align, florastor and culturelle

## 2016-07-30 NOTE — Progress Notes (Signed)
Patient ID: Barbara Burns, female   DOB: 17-Aug-1946, 70 y.o.   MRN: [id number]   Subjective:    Patient ID: Barbara Burns, female    DOB: 04-01-1946, 70 y.o.   MRN: [id number]  HPI  Patient here for a scheduled follow up.  States she is doing well.  Feels good.  Trying to stay active.  Not exercising as much recently.  Plans to restart.  Discussed diet and exercise.  No chest pain.  No sob.  No acid reflux.  No abdominal pain or cramping.  States back in 03/2016 - had episode of emesis and diarrhea - watery.  This acute episode has resolved.  She is still having some loose stool.  Concerned over possible infection.  Request stool studies.  No recent abx use.     Past Medical History:  Diagnosis Date  . Chicken pox   . Endometrial cancer [name])    s/p hysterectomy - 2003  . GERD (gastroesophageal reflux disease)   . Hypercholesterolemia   . Hypertension    Past Surgical History:  Procedure Laterality Date  . ABDOMINAL HYSTERECTOMY     total, may 2003  . APPENDECTOMY     age 16  . COLONOSCOPY WITH PROPOFOL N/A 05/20/2015   Procedure: COLONOSCOPY WITH PROPOFOL;  Surgeon: Manya Silvas, MD;  Location: [name]COPY;  Service: Endoscopy;  Laterality: N/A;  . OOPHORECTOMY     left, secondary to ovarian cyst   Family History  Problem Relation Age of Onset  . Sleep apnea Father     resulting right heart failure  . Heart disease Father   . Hypertension Father   . Lymphoma Mother     non hodgkins lymphoma  . Stroke Maternal Grandmother   . Alcohol abuse Maternal Grandfather   . Diabetes Paternal Grandmother    Social History   Social History  . Marital status: Married    Spouse name: N/A  . Number of children: N/A  . Years of education: N/A   Social History Main Topics  . Smoking status: Former Research scientist (life sciences)  . Smokeless tobacco: Never Used  . Alcohol use No  . Drug use: No  . Sexual activity: No   Other Topics Concern  . None   Social History Narrative  . None     Outpatient Encounter Prescriptions as of 07/30/2016  Medication Sig  . aspirin 81 MG tablet Take 81 mg by mouth daily.  . Calcium Carbonate (CALCIUM 600 PO) Take 1 tablet by mouth 2 (two) times daily.  . Cholecalciferol (VITAMIN D3) 2000 UNITS TABS Take 2,000 Units by mouth daily.  Marland Kitchen co-enzyme Q-10 50 MG capsule Take 50 mg by mouth daily.  . fish oil-omega-3 fatty acids 1000 MG capsule Take 1 g by mouth daily.  . hydrochlorothiazide (MICROZIDE) 12.5 MG capsule Take 1 capsule (12.5 mg total) by mouth daily.  . irbesartan-hydrochlorothiazide (AVALIDE) 150-12.5 MG tablet Take 1 tablet by mouth daily.  Marland Kitchen omeprazole (PRILOSEC) 20 MG capsule Take 20 mg by mouth daily.  . pravastatin (PRAVACHOL) 10 MG tablet TAKE 1 TABLET (10 MG TOTAL) BY MOUTH DAILY.   No facility-administered encounter medications on file as of 07/30/2016.     Review of Systems  Constitutional: Negative for appetite change and unexpected weight change.  HENT: Negative for congestion and sinus pressure.   Respiratory: Negative for cough, chest tightness and shortness of breath.   Cardiovascular: Negative for chest pain, palpitations and leg swelling.  Gastrointestinal: Positive for diarrhea. Negative for  abdominal pain, nausea and vomiting.  Genitourinary: Negative for difficulty urinating and dysuria.  Musculoskeletal: Negative for back pain and joint swelling.  Skin: Negative for color change and rash.  Neurological: Negative for dizziness, light-headedness and headaches.  Psychiatric/Behavioral: Negative for agitation and dysphoric mood.       Objective:    Physical Exam  Constitutional: She appears well-developed and well-nourished. No distress.  HENT:  Nose: Nose normal.  Mouth/Throat: Oropharynx is clear and moist.  Neck: Neck supple. No thyromegaly present.  Cardiovascular: Normal rate and regular rhythm.   Pulmonary/Chest: Breath sounds normal. No respiratory distress. She has no wheezes.  Abdominal:  Soft. Bowel sounds are normal. There is no tenderness.  Musculoskeletal: She exhibits no edema or tenderness.  Lymphadenopathy:    She has no cervical adenopathy.  Skin: No rash noted. No erythema.  Psychiatric: She has a normal mood and affect. Her behavior is normal.    BP 110/60   Pulse 61   Temp 98 F (36.7 C) (Oral)   Ht 5' 3"  (1.6 m)   Wt 197 lb 3.2 oz (89.4 kg)   SpO2 98%   BMI 34.93 kg/m  Wt Readings from Last 3 Encounters:  08/10/16 196 lb 1.9 oz (89 kg)  07/30/16 197 lb 3.2 oz (89.4 kg)  03/26/16 192 lb 8 oz (87.3 kg)     Lab Results  Component Value Date   WBC 6.9 11/24/2015   HGB 13.9 11/24/2015   HCT 40.5 11/24/2015   PLT 281.0 11/24/2015   GLUCOSE 112 (H) 07/26/2016   CHOL 184 07/26/2016   TRIG 119.0 07/26/2016   HDL 52.10 07/26/2016   LDLDIRECT 154.1 08/11/2013   LDLCALC 108 (H) 07/26/2016   ALT 13 07/26/2016   AST 14 07/26/2016   NA 140 07/26/2016   K 3.9 07/26/2016   CL 103 07/26/2016   CREATININE 0.85 07/26/2016   BUN 15 07/26/2016   CO2 31 07/26/2016   TSH 3.88 08/26/2015   HGBA1C 5.8 07/26/2016   MICROALBUR <0.7 02/23/2015    Mm Screening Breast Tomo Bilateral  Result Date: 09/15/2015 CLINICAL DATA:  Screening. EXAM: DIGITAL SCREENING BILATERAL MAMMOGRAM WITH 3D TOMO WITH CAD COMPARISON:  Previous exam(s). ACR Breast Density Category b: There are scattered areas of fibroglandular density. FINDINGS: There are no findings suspicious for malignancy. Images were processed with CAD. IMPRESSION: No mammographic evidence of malignancy. A result letter of this screening mammogram will be mailed directly to the patient. RECOMMENDATION: Screening mammogram in one year. (Code:SM-B-01Y) BI-RADS CATEGORY  1: Negative. Electronically Signed   By: [name] M.D.   On: 09/15/2015 09:58       Assessment & Plan:   Problem List Items Addressed This Visit    Abnormal liver function test    Follow liver function tests.        Relevant Orders    Hepatic function panel   Diarrhea    Persistent loose stool.  Check stool studies.  Start probiotic.  Follow closely.  May need further evaluation.  Colonoscopy 2016.        History of endometrial cancer    Has been followed by Dr Delsa Sale.  Last physical 04/16/16.        Hypercholesterolemia    Low cholesterol diet and exercise.  Follow lipid panel.        Relevant Orders   Lipid panel   Hyperglycemia    Low carb diet and exercise.  Follow met b and a1c.  Relevant Orders   Hemoglobin H4H   Basic metabolic panel   Hypertension    Blood pressure under good control.  Continue same medication regimen.  Follow pressures.  Follow metabolic panel.        Relevant Orders   TSH   Obesity (BMI 30-39.9)    Diet and exercise.        Sleep apnea    Continue cpap.        Other Visit Diagnoses    Loose stools    -  Primary   Relevant Orders   Gastrointestinal Pathogen Panel PCR (Completed)   Breast cancer screening       Relevant Orders   MM DIGITAL SCREENING BILATERAL   Anemia, unspecified type       Relevant Orders   CBC with Differential/Platelet   Ferritin   Vitamin D deficiency       Relevant Orders   VITAMIN D 25 Hydroxy (Vit-D Deficiency, Fractures)       Einar Pheasant, MD

## 2016-07-31 ENCOUNTER — Other Ambulatory Visit: Payer: Medicare HMO

## 2016-07-31 DIAGNOSIS — R197 Diarrhea, unspecified: Secondary | ICD-10-CM | POA: Diagnosis not present

## 2016-07-31 DIAGNOSIS — R195 Other fecal abnormalities: Secondary | ICD-10-CM

## 2016-08-03 LAB — GASTROINTESTINAL PATHOGEN PANEL PCR
C. difficile Tox A/B, PCR: NOT DETECTED
CRYPTOSPORIDIUM, PCR: NOT DETECTED
Campylobacter, PCR: NOT DETECTED
E COLI (ETEC) LT/ST, PCR: NOT DETECTED
E COLI (STEC) STX1/STX2, PCR: NOT DETECTED
E coli 0157, PCR: NOT DETECTED
Giardia lamblia, PCR: NOT DETECTED
NOROVIRUS, PCR: NOT DETECTED
ROTAVIRUS, PCR: NOT DETECTED
SALMONELLA, PCR: NOT DETECTED
SHIGELLA, PCR: NOT DETECTED

## 2016-08-06 ENCOUNTER — Encounter: Payer: Self-pay | Admitting: Internal Medicine

## 2016-08-10 ENCOUNTER — Ambulatory Visit (INDEPENDENT_AMBULATORY_CARE_PROVIDER_SITE_OTHER): Payer: Medicare HMO

## 2016-08-10 VITALS — BP 124/68 | HR 62 | Temp 97.9°F | Resp 12 | Ht 62.75 in | Wt 196.1 lb

## 2016-08-10 DIAGNOSIS — Z0000 Encounter for general adult medical examination without abnormal findings: Secondary | ICD-10-CM | POA: Diagnosis not present

## 2016-08-10 NOTE — Progress Notes (Signed)
Subjective:   Barbara Burns is a 70 y.o. female who presents for an Initial Medicare Annual Wellness Visit.  Review of Systems    No ROS.  Medicare Wellness Visit.  Cardiac Risk Factors include: advanced age (>79men, >27 women);hypertension     Objective:    Today's Vitals   08/10/16 1411  BP: 124/68  Pulse: 62  Resp: 12  Temp: 97.9 F (36.6 C)  TempSrc: Oral  SpO2: 96%  Weight: 196 lb 1.9 oz (89 kg)  Height: 5' 2.75" (1.594 m)   Body mass index is 35.02 kg/m.   Current Medications (verified) Outpatient Encounter Prescriptions as of 08/10/2016  Medication Sig  . aspirin 81 MG tablet Take 81 mg by mouth daily.  . Calcium Carbonate (CALCIUM 600 PO) Take 1 tablet by mouth 2 (two) times daily.  . Cholecalciferol (VITAMIN D3) 2000 UNITS TABS Take 2,000 Units by mouth daily.  Marland Kitchen co-enzyme Q-10 50 MG capsule Take 50 mg by mouth daily.  . fish oil-omega-3 fatty acids 1000 MG capsule Take 1 g by mouth daily.  . hydrochlorothiazide (MICROZIDE) 12.5 MG capsule Take 1 capsule (12.5 mg total) by mouth daily.  . irbesartan-hydrochlorothiazide (AVALIDE) 150-12.5 MG tablet Take 1 tablet by mouth daily.  Marland Kitchen omeprazole (PRILOSEC) 20 MG capsule Take 20 mg by mouth daily.  . pravastatin (PRAVACHOL) 10 MG tablet TAKE 1 TABLET (10 MG TOTAL) BY MOUTH DAILY.   No facility-administered encounter medications on file as of 08/10/2016.     Allergies (verified) Erythromycin   History: Past Medical History:  Diagnosis Date  . Chicken pox   . Endometrial cancer [facility])    s/p hysterectomy - 2003  . GERD (gastroesophageal reflux disease)   . Hypercholesterolemia   . Hypertension    Past Surgical History:  Procedure Laterality Date  . ABDOMINAL HYSTERECTOMY     total, may 2003  . APPENDECTOMY     age 83  . COLONOSCOPY WITH PROPOFOL N/A 05/20/2015   Procedure: COLONOSCOPY WITH PROPOFOL;  Surgeon: Manya Silvas, MD;  Location: [facility] ENDOSCOPY;  Service: Endoscopy;  Laterality: N/A;    . OOPHORECTOMY     left, secondary to ovarian cyst   Family History  Problem Relation Age of Onset  . Sleep apnea Father     resulting right heart failure  . Heart disease Father   . Hypertension Father   . Lymphoma Mother     non hodgkins lymphoma  . Stroke Maternal Grandmother   . Alcohol abuse Maternal Grandfather   . Diabetes Paternal Grandmother    Social History   Occupational History  . Not on file.   Social History Main Topics  . Smoking status: Former Research scientist (life sciences)  . Smokeless tobacco: Never Used  . Alcohol use No  . Drug use: No  . Sexual activity: No    Tobacco Counseling Counseling given: Not Answered   Activities of Daily Living In your present state of health, do you have any difficulty performing the following activities: 08/10/2016  Hearing? N  Vision? N  Difficulty concentrating or making decisions? N  Walking or climbing stairs? N  Dressing or bathing? N  Doing errands, shopping? N  Preparing Food and eating ? N  Using the Toilet? N  In the past six months, have you accidently leaked urine? N  Do you have problems with loss of bowel control? Y  Managing your Medications? N  Managing your Finances? N  Housekeeping or managing your Housekeeping? N  Some recent data  might be hidden    Immunizations and Health Maintenance Immunization History  Administered Date(s) Administered  . Influenza Split 06/17/2013  . Influenza,inj,Quad PF,36+ Mos 06/19/2014, 07/20/2015  . Pneumococcal Conjugate-13 08/17/2013  . Pneumococcal Polysaccharide-23 07/15/2014  . Zoster 09/25/2011   Health Maintenance Due  Topic Date Due  . Hepatitis C Screening  [birth year]    Patient Care Team: Einar Pheasant, MD as PCP - General (Internal Medicine)  Indicate any recent Medical Services you may have received from other than [facility] providers in the past year (date may be approximate).     Assessment:   This is a routine wellness examination for Barbara Burns.  The goal of the  wellness visit is to assist the patient how to close the gaps in care and create a preventative care plan for the patient.   Taking calcium VIT D as appropriate/Osteoporosis risk reviewed.  Medications reviewed; taking without issues or barriers.  Safety issues reviewed; lives alone.  Smoke detectors in the home. No firearms in the home. Wears seatbelts when driving or riding with others. No violence in the home.  No identified risk were noted; The patient was oriented x 3; appropriate in dress and manner and no objective failures at ADL's or IADL's.   BMI; discussed the importance of a healthy diet, water intake and exercise.She has a sensible diet and plans to eat less sweets.  She has adequate water intake and goes to the gym 3 days weekly for exercise.   Hepatitis C screening; discussed.  She plans to have the screening completed elsewhere and follow up with PCP.  Health maintenance gaps; closed.  Patient Concerns: Intermittent loose stools seem worse with ALIGN probiotic, however not happening as often.  Holding probiotic and will follow up with PCP.    Hearing/Vision screen Hearing Screening Comments: Ringing in the ears Some difficulty hearing in crowds Audiologic testing completed  Does not want hearing aids at this time Vision Screening Comments: Followed by [facility]) Wears corrective lenses Last OV 08/2015  Dietary issues and exercise activities discussed: Current Exercise Habits: Structured exercise class, Type of exercise: calisthenics;strength training/weights;stretching;walking, Frequency (Times/Week): 3, Intensity: Moderate  Goals    . Healthy Lifestyle          STAY HYDRATED AND DRINK PLENTY OF FLUIDS. STAY ACTIVE AND CONTINUE EXERCISE REGIMEN. LOW CARB FOODS. VEGETABLES, LEAN MEATS      Depression Screen PHQ 2/9 Scores 07/30/2016 02/25/2015 06/24/2014 02/16/2013 08/24/2012  PHQ - 2 Score 0 0 0 0 0    Fall Risk Fall Risk   07/30/2016 02/25/2015 06/24/2014 02/16/2013 08/24/2012  Falls in the past year? Yes No Yes No No  Number falls in past yr: 1 - 1 - -  Injury with Fall? No - No - -    Cognitive Function: MMSE - Mini Mental State Exam 08/10/2016  Orientation to time 5  Orientation to Place 5  Registration 3  Attention/ Calculation 5  Recall 3  Language- name 2 objects 2  Language- repeat 1  Language- follow 3 step command 3  Language- read & follow direction 1  Write a sentence 1  Copy design 1  Total score 30        Screening Tests Health Maintenance  Topic Date Due  . Hepatitis C Screening  [birth year]  . MAMMOGRAM  09/14/2016  . TETANUS/TDAP  09/25/2019  . COLONOSCOPY  05/19/2020  . INFLUENZA VACCINE  Addressed  . DEXA SCAN  Completed  . ZOSTAVAX  Completed  .  PNA vac Low Risk Adult  Completed      Plan:    End of life planning; Advance aging; Advanced directives discussed. Copy of current HCPOA/Living Will requested.  Medicare Attestation I have personally reviewed: The patient's medical and social history Their use of alcohol, tobacco or illicit drugs Their current medications and supplements The patient's functional ability including ADLs,fall risks, home safety risks, cognitive, and hearing and visual impairment Diet and physical activities Evidence for depression   The patient's weight, height, BMI, and visual acuity have been recorded in the chart.  I have made referrals and provided education to the patient based on review of the above and I have provided the patient with a written personalized care plan for preventive services.    During the course of the visit, Barbara Burns was educated and counseled about the following appropriate screening and preventive services:   Vaccines to include Pneumoccal, Influenza, Hepatitis B, Td, Zostavax, HCV  Electrocardiogram  Cardiovascular disease screening  Colorectal cancer screening  Bone density screening  Diabetes  screening  Glaucoma screening  Mammography/PAP  Nutrition counseling  Smoking cessation counseling  Patient Instructions (the written plan) were given to the patient.    Varney Biles, LPN   [id number]    Reviewed above information.  Agree with plan and to f/u if persistent.    Dr Nicki Reaper

## 2016-08-10 NOTE — Patient Instructions (Addendum)
  Barbara Burns , Thank you for taking time to come for your Medicare Wellness Visit. I appreciate your ongoing commitment to your health goals. Please review the following plan we discussed and let me know if I can assist you in the   [place] DR. Nicki Reaper AS NEEDED.  These are the goals we discussed: Goals    . Healthy Lifestyle          STAY HYDRATED AND DRINK PLENTY OF FLUIDS. STAY ACTIVE AND CONTINUE EXERCISE REGIMEN. LOW CARB FOODS. VEGETABLES, LEAN MEATS       This is a list of the screening recommended for you and due dates:  Health Maintenance  Topic Date Due  .  Hepatitis C: One time screening is recommended by Center for Disease Control  (CDC) for  adults born from [birth year] through 1964.   05-Jan-1946  . Mammogram  09/14/2016  . Tetanus Vaccine  09/25/2019  . Colon Cancer Screening  05/19/2020  . Flu Shot  Addressed  . DEXA scan (bone density measurement)  Completed  . Shingles Vaccine  Completed  . Pneumonia vaccines  Completed

## 2016-08-12 ENCOUNTER — Encounter: Payer: Self-pay | Admitting: Internal Medicine

## 2016-08-12 DIAGNOSIS — R197 Diarrhea, unspecified: Secondary | ICD-10-CM | POA: Insufficient documentation

## 2016-08-12 NOTE — Assessment & Plan Note (Signed)
Blood pressure under good control.  Continue same medication regimen.  Follow pressures.  Follow metabolic panel.   

## 2016-08-12 NOTE — Assessment & Plan Note (Signed)
Low cholesterol diet and exercise.  Follow lipid panel.   

## 2016-08-12 NOTE — Assessment & Plan Note (Signed)
Continue cpap.  

## 2016-08-12 NOTE — Assessment & Plan Note (Signed)
Persistent loose stool.  Check stool studies.  Start probiotic.  Follow closely.  May need further evaluation.  Colonoscopy 2016.

## 2016-08-12 NOTE — Assessment & Plan Note (Signed)
Follow liver function tests.   

## 2016-08-12 NOTE — Assessment & Plan Note (Signed)
Has been followed by Dr Delsa Sale.  Last physical 04/16/16.

## 2016-08-12 NOTE — Assessment & Plan Note (Signed)
Low carb diet and exercise.  Follow met b and a1c.   

## 2016-08-12 NOTE — Assessment & Plan Note (Signed)
Diet and exercise.   

## 2016-09-23 ENCOUNTER — Encounter: Payer: Self-pay | Admitting: Internal Medicine

## 2016-09-25 ENCOUNTER — Encounter: Payer: Self-pay | Admitting: Family Medicine

## 2016-09-25 ENCOUNTER — Ambulatory Visit (INDEPENDENT_AMBULATORY_CARE_PROVIDER_SITE_OTHER): Payer: Medicare HMO | Admitting: Family Medicine

## 2016-09-25 VITALS — BP 138/82 | HR 60 | Ht 63.0 in | Wt 197.0 lb

## 2016-09-25 DIAGNOSIS — M533 Sacrococcygeal disorders, not elsewhere classified: Secondary | ICD-10-CM | POA: Diagnosis not present

## 2016-09-25 DIAGNOSIS — M999 Biomechanical lesion, unspecified: Secondary | ICD-10-CM | POA: Diagnosis not present

## 2016-09-25 NOTE — Assessment & Plan Note (Signed)
Decision today to treat with OMT was based on Physical Exam  After verbal consent patient was treated with HVLE, ME, FPR techniques in thoracic, lumbar and sacral areas  Patient tolerated the procedure well with improvement in symptoms  Patient given exercises, stretches and lifestyle modifications  See medications in patient instructions if given  Patient will follow up in 4-6 weeks

## 2016-09-25 NOTE — Patient Instructions (Signed)
Great to see you  Alvera Singh is your friend. Especially after working out.  Keep it up with working out and add in maybe a day of Yoga and a little elliptical.  Hip abductor strength will be key  If you are in pain ibuprofen 600mg  3 times a day for 3 days.  See me again in 4 weeks.  Happy New Year!

## 2016-09-25 NOTE — Assessment & Plan Note (Signed)
Sacroiliac Joint Mobilization and Rehab 1. Work on pretzel stretching, shoulder back and leg draped in front. 3-5 sets, 30 sec.. 2. hip abductor rotations. standing, hip flexion and rotation outward then inward. 3 sets, 15 reps. when can do comfortably, add ankle weights starting at 2 pounds.  3. cross over stretching - shoulder back to ground, same side leg crossover. 3-5 sets for 30 min..  4. rolling up and back knees to chest and rocking. 5. sacral tilt - 5 sets, hold for 5-10 seconds Discussed with patient differential also includes lumbar radiculopathy which is likely not the primary diagnoses with no radicular symptoms. No significant weakness. Seems to happen after certain activities. Will follow-up again with me in 4-6 weeks.

## 2016-09-25 NOTE — Progress Notes (Signed)
[name] [address] [place], [place] [address] Phone: [phone] Subjective:    I'm seeing this patient by the request  of:  Einar Pheasant, MD   CC: left hip pain   [id number]  Barbara Burns is a 71 y.o. female coming in with complaint of left hip pain.  Patient states is going on for a couple weeks. States that when she is not doing the activities that she was doing previously and seems to make it worse. Patient states sometimes it can be sharp pain on the left side especially when she moves her right leg in certain positions. Patient has been working out on a more regular basis. While she is working out seemed to be doing better. Denies any fevers chills or any abnormal weight loss. No radiation down the legs but when the pain does history she feels like she'll fall.     Past Medical History:  Diagnosis Date  . Chicken pox   . Endometrial cancer [facility])    s/p hysterectomy - 2003  . GERD (gastroesophageal reflux disease)   . Hypercholesterolemia   . Hypertension    Past Surgical History:  Procedure Laterality Date  . ABDOMINAL HYSTERECTOMY     total, may 2003  . APPENDECTOMY     age 35  . COLONOSCOPY WITH PROPOFOL N/A 05/20/2015   Procedure: COLONOSCOPY WITH PROPOFOL;  Surgeon: Manya Silvas, MD;  Location: [facility] ENDOSCOPY;  Service: Endoscopy;  Laterality: N/A;  . OOPHORECTOMY     left, secondary to ovarian cyst   Social History   Social History  . Marital status: Married    Spouse name: N/A  . Number of children: N/A  . Years of education: N/A   Social History Main Topics  . Smoking status: Former Research scientist (life sciences)  . Smokeless tobacco: Never Used  . Alcohol use No  . Drug use: No  . Sexual activity: No   Other Topics Concern  . None   Social History Narrative  . None   Allergies  Allergen Reactions  . Erythromycin Nausea And Vomiting   Family History  Problem Relation Age of Onset  . Sleep apnea Father    resulting right heart failure  . Heart disease Father   . Hypertension Father   . Lymphoma Mother     non hodgkins lymphoma  . Stroke Maternal Grandmother   . Alcohol abuse Maternal Grandfather   . Diabetes Paternal Grandmother     Past medical history, social, surgical and family history all reviewed in electronic medical record.  No pertanent information unless stated regarding to the chief complaint.   Review of Systems:Review of systems updated and as accurate as of 09/25/16  No headache, visual changes, nausea, vomiting, diarrhea, constipation, dizziness, abdominal pain, skin rash, fevers, chills, night sweats, weight loss, swollen lymph nodes, body aches, joint swelling, chest pain, shortness of breath, mood changes.   Objective  Blood pressure 138/82, pulse 60, height 5\' 3"  (1.6 m), weight 197 lb (89.4 kg), SpO2 96 %. Systems examined below as of 09/25/16   General: No apparent distress alert and oriented x3 mood and affect normal, dressed appropriately.  HEENT: Pupils equal, extraocular movements intact  Respiratory: Patient's speak in full sentences and does not appear short of breath  Cardiovascular: No lower extremity edema, non tender, no erythema  Skin: Warm dry intact with no signs of infection or rash on extremities or on axial skeleton.  Abdomen: Soft nontender  Neuro: Cranial nerves  II through XII are intact, neurovascularly intact in all extremities with 2+ DTRs and 2+ pulses.  Lymph: No lymphadenopathy of posterior or anterior cervical chain or axillae bilaterally.  Gait normal with good balance and coordination.  MSK:  Non tender with full range of motion and good stability and symmetric strength and tone of shoulders, elbows, wrist, hip, knee and ankles bilaterally.  Back Exam:  Inspection: Unremarkable  Motion: Flexion 40 deg, Extension 30 deg, Side Bending to 25 deg bilaterally,  Rotation to 25 deg bilaterally  SLR laying: Negative  XSLR laying: Negative    Palpable tenderness: Mild tenderness to palpation over the left sacroiliac joint. FABER: negative. Sensory change: Gross sensation intact to all lumbar and sacral dermatomes.  Reflexes: 2+ at both patellar tendons, 2+ at achilles tendons, Babinski's downgoing.  Strength at foot  Plantar-flexion: 5/5 Dorsi-flexion: 5/5 Eversion: 5/5 Inversion: 5/5  Leg strength  Quad: 5/5 Hamstring: 5/5 Hip flexor: 5/5 Hip abductors: 4/5 but symmetric Gait unremarkable.  Osteopathic findings T3 extended rotated and side bent right inhaled third rib T9 extended rotated and side bent left L2 flexed rotated and side bent right Sacrum right on right  Procedure note 97110; 15 minutes spent for Therapeutic exercises as stated in above notes.  This included exercises focusing on stretching, strengthening, with significant focus on eccentric aspects.  Low back exercises that included:  Pelvic tilt/bracing instruction to focus on control of the pelvic girdle and lower abdominal muscles  Glute strengthening exercises, focusing on proper firing of the glutes without engaging the low back muscles Proper stretching techniques for maximum relief for the hamstrings, hip flexors, low back and some rotation where tolerated  Proper technique shown and discussed handout in great detail with ATC.  All questions were discussed and answered. \    Impression and Recommendations:     This case required medical decision making of moderate complexity.      Note: This dictation was prepared with Dragon dictation along with smaller phrase technology. Any transcriptional errors that result from this process are unintentional.

## 2016-09-26 NOTE — Telephone Encounter (Signed)
Ok to change code to persistent diarrhea.  Not sure if this was just entered wrong.  Please forward this info to the person that can correct this and notify pt.  Thanks

## 2016-10-22 ENCOUNTER — Other Ambulatory Visit: Payer: Self-pay | Admitting: Internal Medicine

## 2016-10-24 ENCOUNTER — Ambulatory Visit (INDEPENDENT_AMBULATORY_CARE_PROVIDER_SITE_OTHER): Payer: Medicare HMO | Admitting: Family Medicine

## 2016-10-24 ENCOUNTER — Encounter: Payer: Self-pay | Admitting: Family Medicine

## 2016-10-24 VITALS — BP 130/80 | HR 62 | Ht 63.0 in | Wt 198.0 lb

## 2016-10-24 DIAGNOSIS — M533 Sacrococcygeal disorders, not elsewhere classified: Secondary | ICD-10-CM | POA: Diagnosis not present

## 2016-10-24 DIAGNOSIS — M999 Biomechanical lesion, unspecified: Secondary | ICD-10-CM

## 2016-10-24 NOTE — Assessment & Plan Note (Signed)
Decision today to treat with OMT was based on Physical Exam  After verbal consent patient was treated with HVLA, ME, FPR techniques in cervical, thoracic, lumbar and sacral areas  Patient tolerated the procedure well with improvement in symptoms  Patient given exercises, stretches and lifestyle modifications  See medications in patient instructions if given  Patient will follow up in 6-8 weeks 

## 2016-10-24 NOTE — Assessment & Plan Note (Signed)
Patient seems to be doing relatively well overall. Has made progress. Encouraged weight loss, we encouraged hip abductor strengthening. Discussed icing regimen. Follow-up again in 6-8 weeks.

## 2016-10-24 NOTE — Patient Instructions (Signed)
God to see you  Thanks for making my job easy Good shoes with rigid bottom.  Barbara Burns, [place] or New balance greater then 700 and xerleo.  Continue the exercises Turmeric 500mg  daily  See me again in 6-8 weeks!

## 2016-10-24 NOTE — Progress Notes (Signed)
[name] [place], [place] [address] Phone: [phone] Subjective:    I'm seeing this patient by the request  of:  Einar Pheasant, MD   CC: left hip pain f/u  [id number]  Barbara Burns is a 71 y.o. female coming in with complaint of left hip pain.  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 Responded very well to osteopathic manipulation. Has been doing conservative therapy. Feels like she is making progress. Patient states that she has not notice any significant pain at all at this time. Feels like she has had some tightness. States that the exercises have been helpful. Continues to stay active.     Past Medical History:  Diagnosis Date  . Chicken pox   . Endometrial cancer [facility])    s/p hysterectomy - 2003  . GERD (gastroesophageal reflux disease)   . Hypercholesterolemia   . Hypertension    Past Surgical History:  Procedure Laterality Date  . ABDOMINAL HYSTERECTOMY     total, may 2003  . APPENDECTOMY     age 60  . COLONOSCOPY WITH PROPOFOL N/A 05/20/2015   Procedure: COLONOSCOPY WITH PROPOFOL;  Surgeon: Manya Silvas, MD;  [facility] ENDOSCOPY;  Service: Endoscopy;  Laterality: N/A;  . OOPHORECTOMY     left, secondary to ovarian cyst   Social History   Social History  . Marital status: Married    Spouse name: N/A  . Number of children: N/A  . Years of education: N/A   Social History Main Topics  . Smoking status: Former Research scientist (life sciences)  . Smokeless tobacco: Never Used  . Alcohol use No  . Drug use: No  . Sexual activity: No   Other Topics Concern  . None   Social History Narrative  . None   Allergies  Allergen Reactions  . Erythromycin Nausea And Vomiting   Family History  Problem Relation Age of Onset  . Sleep apnea Father     resulting right heart failure  . Heart disease Father   . Hypertension Father   . Lymphoma Mother     non hodgkins lymphoma  . Stroke Maternal Grandmother   .  Alcohol abuse Maternal Grandfather   . Diabetes Paternal Grandmother     Past medical history, social, surgical and family history all reviewed in electronic medical record.  No pertanent information unless stated regarding to the chief complaint.   Review of Systems: No headache, visual changes, nausea, vomiting, diarrhea, constipation, dizziness, abdominal pain, skin rash, fevers, chills, night sweats, weight loss, swollen lymph nodes, body aches, joint swelling, muscle aches, chest pain, shortness of breath, mood changes.    Objective  There were no vitals taken for this visit. Systems examined below as of 10/24/16   Systems examined below as of 10/24/16 General: NAD A&O x3 mood, affect normal  HEENT: Pupils equal, extraocular movements intact no nystagmus Respiratory: not short of breath at rest or with speaking Cardiovascular: No lower extremity edema, non tender Skin: Warm dry intact with no signs of infection or rash on extremities or on axial skeleton. Abdomen: Soft nontender, no masses Neuro: Cranial nerves  intact, neurovascularly intact in all extremities with 2+ DTRs and 2+ pulses. Lymph: No lymphadenopathy appreciated today  Gait normal with good balance and coordination.  MSK: Non tender with full range of motion and good stability and symmetric strength and tone of shoulders, elbows, wrist,  knee hips and ankles bilaterally.   Back Exam:  Inspection: Unremarkable  Motion: Flexion 45 deg, Extension 30 deg, Side Bending to 25 deg bilaterally,  Rotation to 25 deg bilaterally improvement in range of motion SLR laying: Negative  XSLR laying: Negative  Palpable tenderness:Continue mild tenderness over the sacroiliac joint FABER: negative. Sensory change: Gross sensation intact to all lumbar and sacral dermatomes.  Reflexes: 2+ at both patellar tendons, 2+ at achilles tendons, Babinski's downgoing.  Strength at foot  Plantar-flexion: 5/5 Dorsi-flexion: 5/5 Eversion: 5/5  Inversion: 5/5  Leg strength  Quad: 5/5 Hamstring: 5/5 Hip flexor: 5/5 Hip abductors: 4/5 but symmetric Gait unremarkable.  Osteopathic findings Cervical C6 flexed rotated and side bent left T3 extended rotated and side bent right inhaled third rib T9 extended rotated and side bent left L2 flexed rotated and side bent right Sacrum right on right .     Impression and Recommendations:     This case required medical decision making of moderate complexity.      Note: This dictation was prepared with Dragon dictation along with smaller phrase technology. Any transcriptional errors that result from this process are unintentional.

## 2016-10-29 DIAGNOSIS — R05 Cough: Secondary | ICD-10-CM | POA: Diagnosis not present

## 2016-10-29 DIAGNOSIS — R69 Illness, unspecified: Secondary | ICD-10-CM | POA: Diagnosis not present

## 2016-11-14 DIAGNOSIS — K219 Gastro-esophageal reflux disease without esophagitis: Secondary | ICD-10-CM | POA: Diagnosis not present

## 2016-11-14 DIAGNOSIS — E669 Obesity, unspecified: Secondary | ICD-10-CM | POA: Diagnosis not present

## 2016-11-14 DIAGNOSIS — Z87891 Personal history of nicotine dependence: Secondary | ICD-10-CM | POA: Diagnosis not present

## 2016-11-14 DIAGNOSIS — Z0000 Encounter for general adult medical examination without abnormal findings: Secondary | ICD-10-CM | POA: Diagnosis not present

## 2016-11-14 DIAGNOSIS — Z7982 Long term (current) use of aspirin: Secondary | ICD-10-CM | POA: Diagnosis not present

## 2016-11-14 DIAGNOSIS — H9313 Tinnitus, bilateral: Secondary | ICD-10-CM | POA: Diagnosis not present

## 2016-11-14 DIAGNOSIS — G629 Polyneuropathy, unspecified: Secondary | ICD-10-CM | POA: Diagnosis not present

## 2016-11-14 DIAGNOSIS — Z6834 Body mass index (BMI) 34.0-34.9, adult: Secondary | ICD-10-CM | POA: Diagnosis not present

## 2016-11-14 DIAGNOSIS — I10 Essential (primary) hypertension: Secondary | ICD-10-CM | POA: Diagnosis not present

## 2016-11-14 DIAGNOSIS — E785 Hyperlipidemia, unspecified: Secondary | ICD-10-CM | POA: Diagnosis not present

## 2016-12-08 ENCOUNTER — Other Ambulatory Visit: Payer: Self-pay | Admitting: Internal Medicine

## 2016-12-11 NOTE — Progress Notes (Deleted)
[name] [address] [place], [place] [address] Phone: [phone] Subjective:    I'm seeing this patient by the request  of:  Einar Pheasant, MD   CC: left hip pain f/u  [id number]  Barbara Burns is a 71 y.o. female coming in with complaint of left hip pain.  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 Responded very well to osteopathic manipulation. Has been doing conservative therapy. Feels like she is making progress. Patient states that she has not notice any significant pain at all at this time. Feels like she has had some tightness. States that the exercises have been helpful. Continues to stay active.     Past Medical History:  Diagnosis Date  . Chicken pox   . Endometrial cancer [facility])    s/p hysterectomy - 2003  . GERD (gastroesophageal reflux disease)   . Hypercholesterolemia   . Hypertension    Past Surgical History:  Procedure Laterality Date  . ABDOMINAL HYSTERECTOMY     total, may 2003  . APPENDECTOMY     age 64  . COLONOSCOPY WITH PROPOFOL N/A 05/20/2015   Procedure: COLONOSCOPY WITH PROPOFOL;  Surgeon: Manya Silvas, MD;  Location: [facility] ENDOSCOPY;  Service: Endoscopy;  Laterality: N/A;  . OOPHORECTOMY     left, secondary to ovarian cyst   Social History   Social History  . Marital status: Married    Spouse name: N/A  . Number of children: N/A  . Years of education: N/A   Social History Main Topics  . Smoking status: Former Research scientist (life sciences)  . Smokeless tobacco: Never Used  . Alcohol use No  . Drug use: No  . Sexual activity: No   Other Topics Concern  . Not on file   Social History Narrative  . No narrative on file   Allergies  Allergen Reactions  . Erythromycin Nausea And Vomiting   Family History  Problem Relation Age of Onset  . Sleep apnea Father     resulting right heart failure  . Heart disease Father   . Hypertension Father   . Lymphoma Mother     non hodgkins lymphoma  . Stroke Maternal  Grandmother   . Alcohol abuse Maternal Grandfather   . Diabetes Paternal Grandmother     Past medical history, social, surgical and family history all reviewed in electronic medical record.  No pertanent information unless stated regarding to the chief complaint.   Review of Systems: No headache, visual changes, nausea, vomiting, diarrhea, constipation, dizziness, abdominal pain, skin rash, fevers, chills, night sweats, weight loss, swollen lymph nodes, body aches, joint swelling, muscle aches, chest pain, shortness of breath, mood changes.    Objective  There were no vitals taken for this visit. Systems examined below as of 12/11/16   Systems examined below as of 12/11/16 General: NAD A&O x3 mood, affect normal  HEENT: Pupils equal, extraocular movements intact no nystagmus Respiratory: not short of breath at rest or with speaking Cardiovascular: No lower extremity edema, non tender Skin: Warm dry intact with no signs of infection or rash on extremities or on axial skeleton. Abdomen: Soft nontender, no masses Neuro: Cranial nerves  intact, neurovascularly intact in all extremities with 2+ DTRs and 2+ pulses. Lymph: No lymphadenopathy appreciated today  Gait normal with good balance and coordination.  MSK: Non tender with full range of motion and good stability and symmetric strength and tone of shoulders, elbows, wrist,  knee hips and ankles bilaterally.  Back Exam:  Inspection: Unremarkable  Motion: Flexion 45 deg, Extension 30 deg, Side Bending to 25 deg bilaterally,  Rotation to 25 deg bilaterally improvement in range of motion SLR laying: Negative  XSLR laying: Negative  Palpable tenderness:Continue mild tenderness over the sacroiliac joint FABER: negative. Sensory change: Gross sensation intact to all lumbar and sacral dermatomes.  Reflexes: 2+ at both patellar tendons, 2+ at achilles tendons, Babinski's downgoing.  Strength at foot  Plantar-flexion: 5/5 Dorsi-flexion: 5/5  Eversion: 5/5 Inversion: 5/5  Leg strength  Quad: 5/5 Hamstring: 5/5 Hip flexor: 5/5 Hip abductors: 4/5 but symmetric Gait unremarkable.  Osteopathic findings Cervical C6 flexed rotated and side bent left T3 extended rotated and side bent right inhaled third rib T9 extended rotated and side bent left L2 flexed rotated and side bent right Sacrum right on right .     Impression and Recommendations:     This case required medical decision making of moderate complexity.      Note: This dictation was prepared with Dragon dictation along with smaller phrase technology. Any transcriptional errors that result from this process are unintentional.

## 2016-12-12 ENCOUNTER — Ambulatory Visit: Payer: Self-pay | Admitting: Family Medicine

## 2016-12-30 NOTE — Progress Notes (Signed)
[name] [address] [place], [place] [address] Phone: [phone] Subjective:    I'm seeing this patient by the request  of:  Einar Pheasant, MD   CC: left hip pain f/u  [id number]  Barbara Burns is a 71 y.o. female coming in with complaint of left hip pain.  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 Patient had been doing fairly well for some time. Patient has been doing remarkably well overall. Patient states she's been able to increase activity. Patient states his long as she stays moving he seems to well.    Past Medical History:  Diagnosis Date  . Chicken pox   . Endometrial cancer [facility])    s/p hysterectomy - 2003  . GERD (gastroesophageal reflux disease)   . Hypercholesterolemia   . Hypertension    Past Surgical History:  Procedure Laterality Date  . ABDOMINAL HYSTERECTOMY     total, may 2003  . APPENDECTOMY     age 29  . COLONOSCOPY WITH PROPOFOL N/A 05/20/2015   Procedure: COLONOSCOPY WITH PROPOFOL;  Surgeon: Manya Silvas, MD;  Location: [facility] ENDOSCOPY;  Service: Endoscopy;  Laterality: N/A;  . OOPHORECTOMY     left, secondary to ovarian cyst   Social History   Social History  . Marital status: Married    Spouse name: N/A  . Number of children: N/A  . Years of education: N/A   Social History Main Topics  . Smoking status: Former Research scientist (life sciences)  . Smokeless tobacco: Never Used  . Alcohol use No  . Drug use: No  . Sexual activity: No   Other Topics Concern  . None   Social History Narrative  . None   Allergies  Allergen Reactions  . Erythromycin Nausea And Vomiting   Family History  Problem Relation Age of Onset  . Sleep apnea Father     resulting right heart failure  . Heart disease Father   . Hypertension Father   . Lymphoma Mother     non hodgkins lymphoma  . Stroke Maternal Grandmother   . Alcohol abuse Maternal Grandfather   . Diabetes Paternal Grandmother     Past medical history, social,  surgical and family history all reviewed in electronic medical record.  No pertanent information unless stated regarding to the chief complaint.   Review of Systems: No headache, visual changes, nausea, vomiting, diarrhea, constipation, dizziness, abdominal pain, skin rash, fevers, chills, night sweats, weight loss, swollen lymph nodes, body aches, joint swelling, muscle aches, chest pain, shortness of breath, mood changes.    Objective  Blood pressure 128/78, pulse 76, resp. rate 16, weight 199 lb 8 oz (90.5 kg), SpO2 97 %.   Systems examined below as of 12/31/16 General: NAD A&O x3 mood, affect normal  HEENT: Pupils equal, extraocular movements intact no nystagmus Respiratory: not short of breath at rest or with speaking Cardiovascular: No lower extremity edema, non tender Skin: Warm dry intact with no signs of infection or rash on extremities or on axial skeleton. Abdomen: Soft nontender, no masses Neuro: Cranial nerves  intact, neurovascularly intact in all extremities with 2+ DTRs and 2+ pulses. Lymph: No lymphadenopathy appreciated today  Gait normal with good balance and coordination.  MSK: Non tender with full range of motion and good stability and symmetric strength and tone of shoulders, elbows, wrist,  knee hips and ankles bilaterally.   Back Exam:  Inspection: Unremarkable  Motion: Flexion 45 deg, Extension 25 deg, Side Bending to  45 deg bilaterally,  Rotation to 45 deg bilaterally  SLR laying: Negative  XSLR laying: Negative  Palpable tenderness: Mild tenderness to palpation over the right sacroiliac joint. FABER: negative. Sensory change: Gross sensation intact to all lumbar and sacral dermatomes.  Reflexes: 2+ at both patellar tendons, 2+ at achilles tendons, Babinski's downgoing.  Strength at foot  Plantar-flexion: 5/5 Dorsi-flexion: 5/5 Eversion: 5/5 Inversion: 5/5  Leg strength  Quad: 5/5 Hamstring: 5/5 Hip flexor: 5/5 Hip abductors: 5/5  Gait  unremarkable.   Osteopathic findings Cervical C2 flexed rotated and side bent left C7 flexed rotated and side bent left T3 extended rotated and side bent right inhaled third rib L2 flexed rotated and side bent right Sacrum left on left  .     Impression and Recommendations:     This case required medical decision making of moderate complexity.      Note: This dictation was prepared with Dragon dictation along with smaller phrase technology. Any transcriptional errors that result from this process are unintentional.

## 2016-12-31 ENCOUNTER — Ambulatory Visit (INDEPENDENT_AMBULATORY_CARE_PROVIDER_SITE_OTHER): Payer: Medicare HMO | Admitting: Family Medicine

## 2016-12-31 ENCOUNTER — Encounter: Payer: Self-pay | Admitting: Family Medicine

## 2016-12-31 VITALS — BP 128/78 | HR 76 | Resp 16 | Wt 199.5 lb

## 2016-12-31 DIAGNOSIS — M533 Sacrococcygeal disorders, not elsewhere classified: Secondary | ICD-10-CM | POA: Diagnosis not present

## 2016-12-31 DIAGNOSIS — M999 Biomechanical lesion, unspecified: Secondary | ICD-10-CM

## 2016-12-31 NOTE — Assessment & Plan Note (Signed)
Patient is doing relatively well at this time. We discussed icing regimen and home exercises. Encourage patient to continue to work on core stability including hip abductors. Respond very well to manipulation. Follow-up again in 2-3 months.

## 2016-12-31 NOTE — Assessment & Plan Note (Signed)
Decision today to treat with OMT was based on Physical Exam  After verbal consent patient was treated with HVLA, ME, FPR techniques in cervical, thoracic, rib lumbar and sacral areas  Patient tolerated the procedure well with improvement in symptoms  Patient given exercises, stretches and lifestyle modifications  See medications in patient instructions if given  Patient will follow up in 8-12 weeks 

## 2016-12-31 NOTE — Patient Instructions (Signed)
I am so proud of you See me again in 2 months!

## 2016-12-31 NOTE — Progress Notes (Signed)
Pre-visit discussion using our clinic review tool. No additional management support is needed unless otherwise documented below in the visit note.  

## 2017-02-04 ENCOUNTER — Encounter: Payer: Self-pay | Admitting: Internal Medicine

## 2017-02-05 DIAGNOSIS — L821 Other seborrheic keratosis: Secondary | ICD-10-CM | POA: Diagnosis not present

## 2017-02-05 DIAGNOSIS — D239 Other benign neoplasm of skin, unspecified: Secondary | ICD-10-CM | POA: Diagnosis not present

## 2017-02-05 DIAGNOSIS — Z808 Family history of malignant neoplasm of other organs or systems: Secondary | ICD-10-CM | POA: Diagnosis not present

## 2017-02-05 DIAGNOSIS — D2261 Melanocytic nevi of right upper limb, including shoulder: Secondary | ICD-10-CM | POA: Diagnosis not present

## 2017-02-05 DIAGNOSIS — L918 Other hypertrophic disorders of the skin: Secondary | ICD-10-CM | POA: Diagnosis not present

## 2017-02-05 DIAGNOSIS — D485 Neoplasm of uncertain behavior of skin: Secondary | ICD-10-CM | POA: Diagnosis not present

## 2017-02-05 NOTE — Telephone Encounter (Signed)
I can see her Tuesday 03/05/17 at 12:00.  Can schedule fasting labs 1-2 days before appt.  Thanks.

## 2017-02-07 ENCOUNTER — Telehealth: Payer: Self-pay | Admitting: Internal Medicine

## 2017-02-19 MED ORDER — IRBESARTAN-HYDROCHLOROTHIAZIDE 150-12.5 MG PO TABS: 1.0000 | ORAL_TABLET | Freq: Every day | ORAL | 0 refills | Status: DC

## 2017-02-19 NOTE — Telephone Encounter (Signed)
I have sent in rx for avalide to [facility] (for #30).  Let us know if any problems.

## 2017-02-19 NOTE — Telephone Encounter (Signed)
Please advise 

## 2017-02-19 NOTE — Telephone Encounter (Signed)
Pt as not received her rx from [facility]. Pt would like to know if we could send over a 2 week rx to her local pharmacy. Please advise, thank.   Pharmacy - [id number] - [address], [place]  Call pt @ [phone]

## 2017-02-19 NOTE — Addendum Note (Signed)
Addended by: Alisa Graff on: 02/19/2017 03:42 PM   Modules accepted: Orders

## 2017-02-19 NOTE — Telephone Encounter (Signed)
What is the medication she is needing?

## 2017-02-19 NOTE — Telephone Encounter (Signed)
Sorry it was at bottom of message  irbesartan-hydrochlorothiazide (AVALIDE) 150-12.5 MG tablet [[id number]]

## 2017-02-20 DIAGNOSIS — J069 Acute upper respiratory infection, unspecified: Secondary | ICD-10-CM | POA: Diagnosis not present

## 2017-02-20 DIAGNOSIS — J209 Acute bronchitis, unspecified: Secondary | ICD-10-CM | POA: Diagnosis not present

## 2017-02-22 ENCOUNTER — Encounter: Payer: Self-pay | Admitting: Internal Medicine

## 2017-02-28 ENCOUNTER — Other Ambulatory Visit (INDEPENDENT_AMBULATORY_CARE_PROVIDER_SITE_OTHER): Payer: Medicare HMO

## 2017-02-28 DIAGNOSIS — E7800 Pure hypercholesterolemia, unspecified: Secondary | ICD-10-CM | POA: Diagnosis not present

## 2017-02-28 DIAGNOSIS — I10 Essential (primary) hypertension: Secondary | ICD-10-CM | POA: Diagnosis not present

## 2017-02-28 DIAGNOSIS — R945 Abnormal results of liver function studies: Secondary | ICD-10-CM | POA: Diagnosis not present

## 2017-02-28 DIAGNOSIS — R739 Hyperglycemia, unspecified: Secondary | ICD-10-CM | POA: Diagnosis not present

## 2017-02-28 DIAGNOSIS — D649 Anemia, unspecified: Secondary | ICD-10-CM | POA: Diagnosis not present

## 2017-02-28 DIAGNOSIS — R7989 Other specified abnormal findings of blood chemistry: Secondary | ICD-10-CM

## 2017-02-28 DIAGNOSIS — E559 Vitamin D deficiency, unspecified: Secondary | ICD-10-CM

## 2017-02-28 LAB — LIPID PANEL
CHOL/HDL RATIO: 5
Cholesterol: 213 mg/dL — ABNORMAL HIGH (ref 0–200)
HDL: 39.5 mg/dL (ref >39.00)
LDL CALC: 145 mg/dL — AB (ref 0–99)
NonHDL: 173.15
TRIGLYCERIDES: 141 mg/dL (ref 0.0–149.0)
VLDL: 28.2 mg/dL (ref 0.0–40.0)

## 2017-02-28 LAB — CBC WITH DIFFERENTIAL/PLATELET
BASOS ABS: 0.3 10*3/uL — AB (ref 0.0–0.1)
Basophils Relative: 3.8 % — ABNORMAL HIGH (ref 0.0–3.0)
EOS ABS: 0.3 10*3/uL (ref 0.0–0.7)
Eosinophils Relative: 3.5 % (ref 0.0–5.0)
HCT: 41.9 % (ref 36.0–46.0)
Hemoglobin: 14.4 g/dL (ref 12.0–15.0)
LYMPHS PCT: 41.4 % (ref 12.0–46.0)
Lymphs Abs: 3.7 10*3/uL (ref 0.7–4.0)
MCHC: 34.2 g/dL (ref 30.0–36.0)
MCV: 86.9 fl (ref 78.0–100.0)
Monocytes Absolute: 0.8 10*3/uL (ref 0.1–1.0)
Monocytes Relative: 8.3 % (ref 3.0–12.0)
NEUTROS ABS: 3.9 10*3/uL (ref 1.4–7.7)
NEUTROS PCT: 43 % (ref 43.0–77.0)
PLATELETS: 326 10*3/uL (ref 150.0–400.0)
RBC: 4.82 Mil/uL (ref 3.87–5.11)
RDW: 13.1 % (ref 11.5–15.5)
WBC: 9 10*3/uL (ref 4.0–10.5)

## 2017-02-28 LAB — VITAMIN D 25 HYDROXY (VIT D DEFICIENCY, FRACTURES): VITD: 26.76 ng/mL — ABNORMAL LOW (ref 30.00–100.00)

## 2017-02-28 LAB — HEMOGLOBIN A1C: Hgb A1c MFr Bld: 6 % (ref 4.6–6.5)

## 2017-02-28 LAB — HEPATIC FUNCTION PANEL
ALT: 10 U/L (ref 0–35)
AST: 11 U/L (ref 0–37)
Albumin: 4.2 g/dL (ref 3.5–5.2)
Alkaline Phosphatase: 70 U/L (ref 39–117)
BILIRUBIN DIRECT: 0.1 mg/dL (ref 0.0–0.3)
BILIRUBIN TOTAL: 0.8 mg/dL (ref 0.2–1.2)
Total Protein: 7.1 g/dL (ref 6.0–8.3)

## 2017-02-28 LAB — TSH: TSH: 3.36 u[IU]/mL (ref 0.35–4.50)

## 2017-02-28 LAB — BASIC METABOLIC PANEL
BUN: 14 mg/dL (ref 6–23)
CO2: 29 mEq/L (ref 19–32)
Calcium: 9.9 mg/dL (ref 8.4–10.5)
Chloride: 104 mEq/L (ref 96–112)
Creatinine, Ser: 0.9 mg/dL (ref 0.40–1.20)
GFR: 65.85 mL/min (ref >60.00)
GLUCOSE: 112 mg/dL — AB (ref 70–99)
Potassium: 4.7 mEq/L (ref 3.5–5.1)
Sodium: 141 mEq/L (ref 135–145)

## 2017-03-01 ENCOUNTER — Encounter: Payer: Self-pay | Admitting: Internal Medicine

## 2017-03-04 ENCOUNTER — Ambulatory Visit (INDEPENDENT_AMBULATORY_CARE_PROVIDER_SITE_OTHER): Payer: Medicare HMO | Admitting: Family Medicine

## 2017-03-04 ENCOUNTER — Encounter: Payer: Self-pay | Admitting: Family Medicine

## 2017-03-04 VITALS — BP 130/78 | HR 62 | Ht 63.0 in | Wt 196.0 lb

## 2017-03-04 DIAGNOSIS — M533 Sacrococcygeal disorders, not elsewhere classified: Secondary | ICD-10-CM | POA: Diagnosis not present

## 2017-03-04 DIAGNOSIS — M999 Biomechanical lesion, unspecified: Secondary | ICD-10-CM

## 2017-03-04 NOTE — Assessment & Plan Note (Addendum)
Decision today to treat with OMT was based on Physical Exam  After verbal consent patient was treated with HVLA, ME, FPR techniques in cervical, thoracic, rib lumbar and sacral areas  Patient tolerated the procedure well with improvement in symptoms  Patient given exercises, stretches and lifestyle modifications  See medications in patient instructions if given  Patient will follow up in 8-10 weeks

## 2017-03-04 NOTE — Progress Notes (Signed)
[name] [address] [place], [place] [address] Phone: [phone] Subjective:    I'm seeing this patient by the request  of:  Einar Pheasant, MD   CC: left hip pain f/u  [id number]  Barbara Burns is a 71 y.o. female coming in with complaint of left hip pain.  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 Mild tightness starting the last 2 weeks. Patient has not number any true pain. Patient was doing a lot of coughing from a recent bronchitis. Feels like making progress.    Past Medical History:  Diagnosis Date  . Chicken pox   . Endometrial cancer [facility])    s/p hysterectomy - 2003  . GERD (gastroesophageal reflux disease)   . Hypercholesterolemia   . Hypertension    Past Surgical History:  Procedure Laterality Date  . ABDOMINAL HYSTERECTOMY     total, may 2003  . APPENDECTOMY     age 35  . COLONOSCOPY WITH PROPOFOL N/A 05/20/2015   Procedure: COLONOSCOPY WITH PROPOFOL;  Surgeon: Manya Silvas, MD;  Location: [facility] ENDOSCOPY;  Service: Endoscopy;  Laterality: N/A;  . OOPHORECTOMY     left, secondary to ovarian cyst   Social History   Social History  . Marital status: Married    Spouse name: N/A  . Number of children: N/A  . Years of education: N/A   Social History Main Topics  . Smoking status: Former Research scientist (life sciences)  . Smokeless tobacco: Never Used  . Alcohol use No  . Drug use: No  . Sexual activity: No   Other Topics Concern  . None   Social History Narrative  . None   Allergies  Allergen Reactions  . Erythromycin Nausea And Vomiting   Family History  Problem Relation Age of Onset  . Sleep apnea Father        resulting right heart failure  . Heart disease Father   . Hypertension Father   . Lymphoma Mother        non hodgkins lymphoma  . Stroke Maternal Grandmother   . Alcohol abuse Maternal Grandfather   . Diabetes Paternal Grandmother     Past medical history, social, surgical and family history all  reviewed in electronic medical record.  No pertanent information unless stated regarding to the chief complaint.   Review of Systems: No headache, visual changes, nausea, vomiting, diarrhea, constipation, dizziness, abdominal pain, skin rash, fevers, chills, night sweats, weight loss, swollen lymph nodes, body aches, joint swelling, muscle aches, chest pain, shortness of breath, mood changes.     Objective  Blood pressure 130/78, pulse 62, height 5\' 3"  (1.6 m), weight 196 lb (88.9 kg), SpO2 97 %.   Systems examined below as of 03/04/17 General: NAD A&O x3 mood, affect normal  HEENT: Pupils equal, extraocular movements intact no nystagmus Respiratory: not short of breath at rest or with speaking Cardiovascular: No lower extremity edema, non tender Skin: Warm dry intact with no signs of infection or rash on extremities or on axial skeleton. Abdomen: Soft nontender, no masses Neuro: Cranial nerves  intact, neurovascularly intact in all extremities with 2+ DTRs and 2+ pulses. Lymph: No lymphadenopathy appreciated today  Gait normal with good balance and coordination.  MSK: Non tender with full range of motion and good stability and symmetric strength and tone of shoulders, elbows, wrist,  knee hips and ankles bilaterally.   Back Exam:  Inspection: Unremarkable  Motion: Flexion 40 deg, Extension 25 deg, Side Bending to  40 deg bilaterally,  Rotation to 30 deg bilaterally  SLR laying: Negative  XSLR laying: Negative  Palpable tenderness: Tender to palpation of the paraspinal musculature lumbar spine.Marland Kitchen FABER: Mild discomfort  Sensory change: Gross sensation intact to all lumbar and sacral dermatomes.  Reflexes: 2+ at both patellar tendons, 2+ at achilles tendons, Babinski's downgoing.  Strength at foot  Plantar-flexion: 5/5 Dorsi-flexion: 5/5 Eversion: 5/5 Inversion: 5/5  Leg strength  Quad: 5/5 Hamstring: 5/5 Hip flexor: 5/5 Hip abductors: 5/5  Gait unremarkable.  Osteopathic  findings C2 flexed rotated and side bent right C4 flexed rotated and side bent left T3 extended rotated and side bent right inhaled third rib T6 extended rotated and side bent left L2 flexed rotated and side bent right Sacrum right on right .     Impression and Recommendations:     This case required medical decision making of moderate complexity.      Note: This dictation was prepared with Dragon dictation along with smaller phrase technology. Any transcriptional errors that result from this process are unintentional.

## 2017-03-04 NOTE — Patient Instructions (Signed)
Play tennis  You know the drill  See me again in 8-10 weeks

## 2017-03-04 NOTE — Assessment & Plan Note (Signed)
Patient does have some tightness overall. Has been doing relatively well. We'll make no significant changes. Patient though does see me every 8-10 weeks. Patient may start playing tennis and if so we may need to address some other core instability.

## 2017-03-05 ENCOUNTER — Ambulatory Visit (INDEPENDENT_AMBULATORY_CARE_PROVIDER_SITE_OTHER): Payer: Medicare HMO | Admitting: Internal Medicine

## 2017-03-05 ENCOUNTER — Encounter: Payer: Self-pay | Admitting: Internal Medicine

## 2017-03-05 VITALS — BP 130/78 | HR 63 | Temp 98.7°F | Resp 12 | Ht 63.0 in | Wt 197.4 lb

## 2017-03-05 DIAGNOSIS — E669 Obesity, unspecified: Secondary | ICD-10-CM | POA: Diagnosis not present

## 2017-03-05 DIAGNOSIS — E7800 Pure hypercholesterolemia, unspecified: Secondary | ICD-10-CM

## 2017-03-05 DIAGNOSIS — G4730 Sleep apnea, unspecified: Secondary | ICD-10-CM

## 2017-03-05 DIAGNOSIS — Z1159 Encounter for screening for other viral diseases: Secondary | ICD-10-CM | POA: Diagnosis not present

## 2017-03-05 DIAGNOSIS — R945 Abnormal results of liver function studies: Secondary | ICD-10-CM | POA: Diagnosis not present

## 2017-03-05 DIAGNOSIS — I10 Essential (primary) hypertension: Secondary | ICD-10-CM | POA: Diagnosis not present

## 2017-03-05 DIAGNOSIS — R7989 Other specified abnormal findings of blood chemistry: Secondary | ICD-10-CM

## 2017-03-05 DIAGNOSIS — Z8542 Personal history of malignant neoplasm of other parts of uterus: Secondary | ICD-10-CM | POA: Diagnosis not present

## 2017-03-05 DIAGNOSIS — R739 Hyperglycemia, unspecified: Secondary | ICD-10-CM

## 2017-03-05 MED ORDER — IRBESARTAN-HYDROCHLOROTHIAZIDE 150-12.5 MG PO TABS: 1.0000 | ORAL_TABLET | Freq: Every day | ORAL | 2 refills | Status: DC

## 2017-03-05 NOTE — Progress Notes (Signed)
Patient ID: Barbara Burns, female   DOB: [birth year], 71 y.o.   MRN: [id number]   Subjective:    Patient ID: Barbara Burns, female    DOB: December 17, 1945, 71 y.o.   MRN: [id number]  HPI  Patient here for a scheduled follow up.  She reports she is doing relatively well.  Not exercising as much.  Plans to start back.  Discussed diet and exercise.  No chest pain.  No sob.  No acid reflux.  No abdominal pain.  Bowels moving.  Increased stress.  Discussed with her today.  She does not feel needs any further intervention.     Past Medical History:  Diagnosis Date  . Chicken pox   . Endometrial cancer [facility])    s/p hysterectomy - 2003  . GERD (gastroesophageal reflux disease)   . Hypercholesterolemia   . Hypertension    Past Surgical History:  Procedure Laterality Date  . ABDOMINAL HYSTERECTOMY     total, may 2003  . APPENDECTOMY     age 62  . COLONOSCOPY WITH PROPOFOL N/A 05/20/2015   Procedure: COLONOSCOPY WITH PROPOFOL;  Surgeon: Manya Silvas, MD;  Location: [facility] ENDOSCOPY;  Service: Endoscopy;  Laterality: N/A;  . OOPHORECTOMY     left, secondary to ovarian cyst   Family History  Problem Relation Age of Onset  . Sleep apnea Father        resulting right heart failure  . Heart disease Father   . Hypertension Father   . Lymphoma Mother        non hodgkins lymphoma  . Stroke Maternal Grandmother   . Alcohol abuse Maternal Grandfather   . Diabetes Paternal Grandmother    Social History   Social History  . Marital status: Married    Spouse name: N/A  . Number of children: N/A  . Years of education: N/A   Social History Main Topics  . Smoking status: Former Research scientist (life sciences)  . Smokeless tobacco: Never Used  . Alcohol use No  . Drug use: No  . Sexual activity: No   Other Topics Concern  . None   Social History Narrative  . None    Outpatient Encounter Prescriptions as of 03/05/2017  Medication Sig  . aspirin 81 MG tablet Take 81 mg by mouth daily.  . Calcium  Carbonate (CALCIUM 600 PO) Take 1 tablet by mouth 2 (two) times daily.  . Cholecalciferol (VITAMIN D3) 2000 UNITS TABS Take 2,000 Units by mouth daily.  Marland Kitchen co-enzyme Q-10 50 MG capsule Take 50 mg by mouth daily.  . fish oil-omega-3 fatty acids 1000 MG capsule Take 1 g by mouth daily.  . irbesartan-hydrochlorothiazide (AVALIDE) 150-12.5 MG tablet Take 1 tablet by mouth daily.  Marland Kitchen omeprazole (PRILOSEC) 20 MG capsule Take 20 mg by mouth daily.  . Turmeric 500 MG CAPS Take 1 capsule by mouth daily.  . [DISCONTINUED] irbesartan-hydrochlorothiazide (AVALIDE) 150-12.5 MG tablet Take 1 tablet by mouth daily.   No facility-administered encounter medications on file as of 03/05/2017.     Review of Systems  Constitutional: Negative for appetite change and unexpected weight change.  HENT: Negative for congestion and sinus pressure.   Respiratory: Negative for cough, chest tightness and shortness of breath.   Cardiovascular: Negative for chest pain, palpitations and leg swelling.  Gastrointestinal: Negative for abdominal pain, diarrhea, nausea and vomiting.  Genitourinary: Negative for difficulty urinating and dysuria.  Musculoskeletal: Negative for back pain and joint swelling.  Skin: Negative for color change and rash.  Neurological:  Negative for dizziness, light-headedness and headaches.  Psychiatric/Behavioral: Negative for agitation and dysphoric mood.       Objective:    Physical Exam  Constitutional: She appears well-developed and well-nourished. No distress.  HENT:  Nose: Nose normal.  Mouth/Throat: Oropharynx is clear and moist.  Neck: Neck supple. No thyromegaly present.  Cardiovascular: Normal rate and regular rhythm.   Pulmonary/Chest: Breath sounds normal. No respiratory distress. She has no wheezes.  Abdominal: Soft. Bowel sounds are normal. There is no tenderness.  Musculoskeletal: She exhibits no edema or tenderness.  Lymphadenopathy:    She has no cervical adenopathy.  Skin:  No rash noted. No erythema.  Psychiatric: She has a normal mood and affect. Her behavior is normal.    BP 130/78 (BP Location: Left Arm, Patient Position: Sitting, Cuff Size: Normal)   Pulse 63   Temp 98.7 F (37.1 C) (Oral)   Resp 12   Ht 5' 3"  (1.6 m)   Wt 197 lb 6.4 oz (89.5 kg)   SpO2 98%   BMI 34.97 kg/m  Wt Readings from Last 3 Encounters:  03/05/17 197 lb 6.4 oz (89.5 kg)  03/04/17 196 lb (88.9 kg)  12/31/16 199 lb 8 oz (90.5 kg)     Lab Results  Component Value Date   WBC 9.0 02/28/2017   HGB 14.4 02/28/2017   HCT 41.9 02/28/2017   PLT 326.0 02/28/2017   GLUCOSE 112 (H) 02/28/2017   CHOL 213 (H) 02/28/2017   TRIG 141.0 02/28/2017   HDL 39.50 02/28/2017   LDLDIRECT 154.1 08/11/2013   LDLCALC 145 (H) 02/28/2017   ALT 10 02/28/2017   AST 11 02/28/2017   NA 141 02/28/2017   K 4.7 02/28/2017   CL 104 02/28/2017   CREATININE 0.90 02/28/2017   BUN 14 02/28/2017   CO2 29 02/28/2017   TSH 3.36 02/28/2017   HGBA1C 6.0 02/28/2017   MICROALBUR <0.7 02/23/2015    Mm Screening Breast Tomo Bilateral  Result Date: 09/15/2015 CLINICAL DATA:  Screening. EXAM: DIGITAL SCREENING BILATERAL MAMMOGRAM WITH 3D TOMO WITH CAD COMPARISON:  Previous exam(s). ACR Breast Density Category b: There are scattered areas of fibroglandular density. FINDINGS: There are no findings suspicious for malignancy. Images were processed with CAD. IMPRESSION: No mammographic evidence of malignancy. A result letter of this screening mammogram will be mailed directly to the patient. RECOMMENDATION: Screening mammogram in one year. (Code:SM-B-01Y) BI-RADS CATEGORY  1: Negative. Electronically Signed   By: [name] M.D.   On: 09/15/2015 09:58       Assessment & Plan:   Problem List Items Addressed This Visit    Abnormal liver function test    Follow liver panel.        Relevant Orders   Hepatic function panel   History of endometrial cancer    Has been followed by Dr Delsa Sale.   (03/2016).       Hypercholesterolemia    Low cholesterol diet and exercise.  Discussed calculated risk.  Discussed medication.  She prefers to hold on starting.  Follow lipid panel.        Relevant Medications   irbesartan-hydrochlorothiazide (AVALIDE) 150-12.5 MG tablet   Other Relevant Orders   Lipid panel   Hyperglycemia    Low carb diet and exercise.  Follow met b and a1c.        Relevant Orders   Hemoglobin A1c   Hypertension    Blood pressure under good control.  Continue same medication regimen.  Follow pressures.  Follow metabolic panel.        Relevant Medications   irbesartan-hydrochlorothiazide (AVALIDE) 150-12.5 MG tablet   Other Relevant Orders   Basic metabolic panel   Obesity (BMI 30-39.9)    Diet and exercise.  Follow.        Sleep apnea    CPAP.        Other Visit Diagnoses    Need for hepatitis C screening test    -  Primary   Relevant Orders   Hepatitis C antibody       Einar Pheasant, MD

## 2017-03-05 NOTE — Progress Notes (Signed)
Pre-visit discussion using our clinic review tool. No additional management support is needed unless otherwise documented below in the visit note.  

## 2017-03-07 ENCOUNTER — Encounter: Payer: Self-pay | Admitting: Internal Medicine

## 2017-03-07 NOTE — Assessment & Plan Note (Signed)
Low cholesterol diet and exercise.  Discussed calculated risk.  Discussed medication.  She prefers to hold on starting.  Follow lipid panel.

## 2017-03-07 NOTE — Assessment & Plan Note (Signed)
Diet and exercise.  Follow.  

## 2017-03-07 NOTE — Assessment & Plan Note (Signed)
Blood pressure under good control.  Continue same medication regimen.  Follow pressures.  Follow metabolic panel.   

## 2017-03-07 NOTE — Assessment & Plan Note (Signed)
Follow liver panel.  

## 2017-03-07 NOTE — Assessment & Plan Note (Signed)
Has been followed by Dr Delsa Sale.  (03/2016).

## 2017-03-07 NOTE — Assessment & Plan Note (Signed)
CPAP.  

## 2017-03-07 NOTE — Assessment & Plan Note (Signed)
Low carb diet and exercise.  Follow met b and a1c.   

## 2017-03-18 ENCOUNTER — Other Ambulatory Visit: Payer: Self-pay | Admitting: Internal Medicine

## 2017-05-13 ENCOUNTER — Encounter: Payer: Self-pay | Admitting: Family Medicine

## 2017-05-13 ENCOUNTER — Ambulatory Visit (INDEPENDENT_AMBULATORY_CARE_PROVIDER_SITE_OTHER): Payer: Medicare HMO | Admitting: Family Medicine

## 2017-05-13 VITALS — BP 132/84 | HR 67 | Ht 63.0 in | Wt 200.0 lb

## 2017-05-13 DIAGNOSIS — M999 Biomechanical lesion, unspecified: Secondary | ICD-10-CM

## 2017-05-13 DIAGNOSIS — M533 Sacrococcygeal disorders, not elsewhere classified: Secondary | ICD-10-CM | POA: Diagnosis not present

## 2017-05-13 NOTE — Patient Instructions (Signed)
You are awesome OK to have discomfort but not pain  Try tennis again after labor day  See me again in 10-12 weeks!

## 2017-05-13 NOTE — Progress Notes (Signed)
[facility] [address] [place], [place] [address] Phone: [phone] Subjective:    I'm seeing this patient by the request  of:  Einar Pheasant, MD   CC: left hip pain f/u  QIH:[id number]  Barbara Burns is a 71 y.o. female coming in with complaint of left hip pain.  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 Done well overall. Has been 10 weeks since we seen patient. Patient denies any new symptoms. Has worsening of previous symptoms. Patient has been very active. Attempted plain tenderness and forcefully had worsening pain though.    Past Medical History:  Diagnosis Date  . Chicken pox   . Endometrial cancer [facility])    s/p hysterectomy - 2003  . GERD (gastroesophageal reflux disease)   . Hypercholesterolemia   . Hypertension    Past Surgical History:  Procedure Laterality Date  . ABDOMINAL HYSTERECTOMY     total, may 2003  . APPENDECTOMY     age 14  . COLONOSCOPY WITH PROPOFOL N/A 05/20/2015   Procedure: COLONOSCOPY WITH PROPOFOL;  Surgeon: Manya Silvas, MD;  Location: [facility] ENDOSCOPY;  Service: Endoscopy;  Laterality: N/A;  . OOPHORECTOMY     left, secondary to ovarian cyst   Social History   Social History  . Marital status: Married    Spouse name: N/A  . Number of children: N/A  . Years of education: N/A   Social History Main Topics  . Smoking status: Former Research scientist (life sciences)  . Smokeless tobacco: Never Used  . Alcohol use No  . Drug use: No  . Sexual activity: No   Other Topics Concern  . None   Social History Narrative  . None   Allergies  Allergen Reactions  . Erythromycin Nausea And Vomiting   Family History  Problem Relation Age of Onset  . Sleep apnea Father        resulting right heart failure  . Heart disease Father   . Hypertension Father   . Lymphoma Mother        non hodgkins lymphoma  . Stroke Maternal Grandmother   . Alcohol abuse Maternal Grandfather   . Diabetes Paternal Grandmother     Past  medical history, social, surgical and family history all reviewed in electronic medical record.  No pertanent information unless stated regarding to the chief complaint.   Review of Systems: No headache, visual changes, nausea, vomiting, diarrhea, constipation, dizziness, abdominal pain, skin rash, fevers, chills, night sweats, weight loss, swollen lymph nodes, body aches, joint swelling, muscle aches, chest pain, shortness of breath, mood changes.    Objective  Blood pressure 132/84, pulse 67, height 5\' 3"  (1.6 m), weight 200 lb (90.7 kg), SpO2 97 %.   Systems examined below as of 05/13/17 General: NAD A&O x3 mood, affect normal  HEENT: Pupils equal, extraocular movements intact no nystagmus Respiratory: not short of breath at rest or with speaking Cardiovascular: No lower extremity edema, non tender Skin: Warm dry intact with no signs of infection or rash on extremities or on axial skeleton. Abdomen: Soft nontender, no masses Neuro: Cranial nerves  intact, neurovascularly intact in all extremities with 2+ DTRs and 2+ pulses. Lymph: No lymphadenopathy appreciated today  Gait normal with good balance and coordination.  MSK: Non tender with full range of motion and good stability and symmetric strength and tone of shoulders, elbows, wrist,  knee hips and ankles bilaterally.     Back Exam:  Inspection: Mild loss of lordosis Motion:  Flexion 35 deg, Extension 25 deg, Side Bending to 40 deg bilaterally,  Rotation to 30 deg bilaterally mild increase in stiffness SLR laying: Negative  XSLR laying: Negative  Palpable tenderness: Mild to moderate discomfort in the paraspinal musculature lumbar spine FABER: Mild discomfort left side only Sensory change: Gross sensation intact to all lumbar and sacral dermatomes.  Reflexes: 2+ at both patellar tendons, 2+ at achilles tendons, Babinski's downgoing.  Strength at foot  Plantar-flexion: 5/5 Dorsi-flexion: 5/5 Eversion: 5/5 Inversion: 5/5  Leg  strength  Quad: 5/5 Hamstring: 5/5 Hip flexor: 5/5 Hip abductors: 5/5  Gait unremarkable.  Osteopathic findings C2 flexed rotated and side bent right C4 flexed rotated and side bent left C7 flexed rotated and side bent left T3 extended rotated and side bent right inhaled third rib T9 extended rotated and side bent left L3 flexed rotated and side bent right Sacrum right on right  .     Impression and Recommendations:     This case required medical decision making of moderate complexity.      Note: This dictation was prepared with Dragon dictation along with smaller phrase technology. Any transcriptional errors that result from this process are unintentional.

## 2017-05-13 NOTE — Assessment & Plan Note (Signed)
Doing well overall. Discuss continuing work on hip abductor strengthening and range of motion. We discussed icing regimen, we discussed home exercises, we discussed which activities doing which was to avoid. Patient will wear better shoes. Patient will decrease the amount of tendon she is plain and follow-up with me again in 10-12 weeks.

## 2017-05-13 NOTE — Assessment & Plan Note (Signed)
Decision today to treat with OMT was based on Physical Exam  After verbal consent patient was treated with HVLA, ME, FPR techniques in cervical, thoracic, rib lumbar and sacral areas  Patient tolerated the procedure well with improvement in symptoms  Patient given exercises, stretches and lifestyle modifications  See medications in patient instructions if given  Patient will follow up in 12 weeks 

## 2017-07-11 ENCOUNTER — Other Ambulatory Visit (INDEPENDENT_AMBULATORY_CARE_PROVIDER_SITE_OTHER): Payer: Medicare HMO

## 2017-07-11 DIAGNOSIS — R739 Hyperglycemia, unspecified: Secondary | ICD-10-CM

## 2017-07-11 DIAGNOSIS — E7800 Pure hypercholesterolemia, unspecified: Secondary | ICD-10-CM | POA: Diagnosis not present

## 2017-07-11 DIAGNOSIS — I10 Essential (primary) hypertension: Secondary | ICD-10-CM

## 2017-07-11 DIAGNOSIS — R945 Abnormal results of liver function studies: Secondary | ICD-10-CM | POA: Diagnosis not present

## 2017-07-11 DIAGNOSIS — Z1159 Encounter for screening for other viral diseases: Secondary | ICD-10-CM

## 2017-07-11 DIAGNOSIS — R7989 Other specified abnormal findings of blood chemistry: Secondary | ICD-10-CM

## 2017-07-11 LAB — LIPID PANEL
Cholesterol: 218 mg/dL — ABNORMAL HIGH (ref 0–200)
HDL: 44.9 mg/dL (ref >39.00)
LDL Cholesterol: 144 mg/dL — ABNORMAL HIGH (ref 0–99)
NONHDL: 173.29
Total CHOL/HDL Ratio: 5
Triglycerides: 147 mg/dL (ref 0.0–149.0)
VLDL: 29.4 mg/dL (ref 0.0–40.0)

## 2017-07-11 LAB — BASIC METABOLIC PANEL
BUN: 19 mg/dL (ref 6–23)
CHLORIDE: 101 meq/L (ref 96–112)
CO2: 31 mEq/L (ref 19–32)
CREATININE: 0.82 mg/dL (ref 0.40–1.20)
Calcium: 9.6 mg/dL (ref 8.4–10.5)
GFR: 73.24 mL/min (ref >60.00)
Glucose, Bld: 108 mg/dL — ABNORMAL HIGH (ref 70–99)
Potassium: 4 mEq/L (ref 3.5–5.1)
Sodium: 139 mEq/L (ref 135–145)

## 2017-07-11 LAB — HEPATIC FUNCTION PANEL
ALT: 12 U/L (ref 0–35)
AST: 12 U/L (ref 0–37)
Albumin: 4.2 g/dL (ref 3.5–5.2)
Alkaline Phosphatase: 69 U/L (ref 39–117)
Bilirubin, Direct: 0.1 mg/dL (ref 0.0–0.3)
Total Bilirubin: 1.1 mg/dL (ref 0.2–1.2)
Total Protein: 7 g/dL (ref 6.0–8.3)

## 2017-07-11 LAB — HEMOGLOBIN A1C: HEMOGLOBIN A1C: 6 % (ref 4.6–6.5)

## 2017-07-12 ENCOUNTER — Encounter: Payer: Self-pay | Admitting: Internal Medicine

## 2017-07-12 LAB — HEPATITIS C ANTIBODY
HEP C AB: NONREACTIVE
SIGNAL TO CUT-OFF: 0.01 (ref <1.00)

## 2017-07-12 NOTE — Telephone Encounter (Signed)
See her my chart message.  Please call her and see if she was talking about getting the second shingles vaccine today.  (she stated Friday).  I do not see documentation where she had here, but I may be missing.  Just need to clarify what is needed.  Also, I can discuss with her at her appt - the PPI issues.   Thanks

## 2017-07-15 ENCOUNTER — Encounter: Payer: Self-pay | Admitting: Internal Medicine

## 2017-07-15 ENCOUNTER — Ambulatory Visit (INDEPENDENT_AMBULATORY_CARE_PROVIDER_SITE_OTHER): Payer: Medicare HMO | Admitting: Internal Medicine

## 2017-07-15 VITALS — BP 118/66 | HR 71 | Temp 98.6°F | Resp 14 | Ht 63.0 in | Wt 198.8 lb

## 2017-07-15 DIAGNOSIS — Z8542 Personal history of malignant neoplasm of other parts of uterus: Secondary | ICD-10-CM | POA: Diagnosis not present

## 2017-07-15 DIAGNOSIS — G4730 Sleep apnea, unspecified: Secondary | ICD-10-CM | POA: Diagnosis not present

## 2017-07-15 DIAGNOSIS — R739 Hyperglycemia, unspecified: Secondary | ICD-10-CM

## 2017-07-15 DIAGNOSIS — I10 Essential (primary) hypertension: Secondary | ICD-10-CM | POA: Diagnosis not present

## 2017-07-15 DIAGNOSIS — E669 Obesity, unspecified: Secondary | ICD-10-CM | POA: Diagnosis not present

## 2017-07-15 DIAGNOSIS — R945 Abnormal results of liver function studies: Secondary | ICD-10-CM

## 2017-07-15 DIAGNOSIS — Z1231 Encounter for screening mammogram for malignant neoplasm of breast: Secondary | ICD-10-CM | POA: Diagnosis not present

## 2017-07-15 DIAGNOSIS — Z0000 Encounter for general adult medical examination without abnormal findings: Secondary | ICD-10-CM | POA: Diagnosis not present

## 2017-07-15 DIAGNOSIS — Z1239 Encounter for other screening for malignant neoplasm of breast: Secondary | ICD-10-CM

## 2017-07-15 DIAGNOSIS — E7800 Pure hypercholesterolemia, unspecified: Secondary | ICD-10-CM

## 2017-07-15 DIAGNOSIS — R7989 Other specified abnormal findings of blood chemistry: Secondary | ICD-10-CM

## 2017-07-15 MED ORDER — HYDROCHLOROTHIAZIDE 12.5 MG PO CAPS
12.5000 mg | ORAL_CAPSULE | Freq: Every day | ORAL | 1 refills | Status: DC
Start: 2017-07-15 — End: 2018-02-13

## 2017-07-15 MED ORDER — IRBESARTAN-HYDROCHLOROTHIAZIDE 150-12.5 MG PO TABS: 1.0000 | ORAL_TABLET | Freq: Every day | ORAL | 2 refills | Status: DC

## 2017-07-15 NOTE — Assessment & Plan Note (Signed)
Physical today 07/15/17.  Colonoscopy 09/04/10

## 2017-07-15 NOTE — Progress Notes (Signed)
Patient ID: Barbara Burns, female   DOB: 03/24/1946, 71 y.o.   MRN: [id number]   Subjective:    Patient ID: Barbara Burns, female    DOB: 05/18/1946, 71 y.o.   MRN: [id number]  HPI  Patient here for her physical exam.  She reports she is doing relatively well.  Dealing with increased stress.  Does not feel needs any further intervention.  No chest pain.  No sob.  No acid reflux.  No abdominal pain.  Bowels moving.  Blood pressure was elevated.  She started back on additional hctz (12.10m).  Is better.     Past Medical History:  Diagnosis Date  . Chicken pox   . Endometrial cancer ([facility]    s/p hysterectomy - 2003  . GERD (gastroesophageal reflux disease)   . Hypercholesterolemia   . Hypertension    Past Surgical History:  Procedure Laterality Date  . ABDOMINAL HYSTERECTOMY     total, may 2003  . APPENDECTOMY     age 71 . COLONOSCOPY WITH PROPOFOL N/A 05/20/2015   Procedure: COLONOSCOPY WITH PROPOFOL;  Surgeon: RManya Silvas MD;  [facility];  Service: Endoscopy;  Laterality: N/A;  . OOPHORECTOMY     left, secondary to ovarian cyst   Family History  Problem Relation Age of Onset  . Sleep apnea Father        resulting right heart failure  . Heart disease Father   . Hypertension Father   . Lymphoma Mother        non hodgkins lymphoma  . Stroke Maternal Grandmother   . Alcohol abuse Maternal Grandfather   . Diabetes Paternal Grandmother    Social History   Social History  . Marital status: Married    Spouse name: N/A  . Number of children: N/A  . Years of education: N/A   Social History Main Topics  . Smoking status: Former SResearch scientist (life sciences) . Smokeless tobacco: Never Used  . Alcohol use No  . Drug use: No  . Sexual activity: No   Other Topics Concern  . None   Social History Narrative  . None    Outpatient Encounter Prescriptions as of 07/15/2017  Medication Sig  . aspirin 81 MG tablet Take 81 mg by mouth daily.  . Calcium Carbonate  (CALCIUM 600 PO) Take 1 tablet by mouth 2 (two) times daily.  . Cholecalciferol (VITAMIN D3) 2000 UNITS TABS Take 2,000 Units by mouth daily.  .Marland Kitchenco-enzyme Q-10 50 MG capsule Take 50 mg by mouth daily.  . fish oil-omega-3 fatty acids 1000 MG capsule Take 1 g by mouth daily.  . hydrochlorothiazide (MICROZIDE) 12.5 MG capsule Take 1 capsule (12.5 mg total) by mouth daily.  . irbesartan-hydrochlorothiazide (AVALIDE) 150-12.5 MG tablet Take 1 tablet by mouth daily.  .Marland Kitchenomeprazole (PRILOSEC) 20 MG capsule Take 20 mg by mouth daily.  . Turmeric 500 MG CAPS Take 1 capsule by mouth daily.  . [DISCONTINUED] hydrochlorothiazide (MICROZIDE) 12.5 MG capsule Take 12.5 mg by mouth daily.  . [DISCONTINUED] irbesartan-hydrochlorothiazide (AVALIDE) 150-12.5 MG tablet Take 1 tablet by mouth daily.  . [DISCONTINUED] irbesartan-hydrochlorothiazide (AVALIDE) 150-12.5 MG tablet TAKE 1 TABLET BY MOUTH EVERY DAY   No facility-administered encounter medications on file as of 07/15/2017.     Review of Systems  Constitutional: Negative for appetite change and unexpected weight change.  HENT: Negative for congestion and sinus pressure.   Eyes: Negative for pain and visual disturbance.  Respiratory: Negative for cough, chest tightness and shortness of  breath.   Cardiovascular: Negative for chest pain, palpitations and leg swelling.  Gastrointestinal: Negative for abdominal pain, diarrhea, nausea and vomiting.  Genitourinary: Negative for difficulty urinating and dysuria.  Musculoskeletal: Negative for back pain and joint swelling.  Skin: Negative for color change and rash.  Neurological: Negative for dizziness and headaches.  Hematological: Negative for adenopathy. Does not bruise/bleed easily.  Psychiatric/Behavioral: Negative for agitation and dysphoric mood.       Objective:    Physical Exam  Constitutional: She is oriented to person, place, and time. She appears well-developed and well-nourished. No distress.   HENT:  Nose: Nose normal.  Mouth/Throat: Oropharynx is clear and moist.  Eyes: Right eye exhibits no discharge. Left eye exhibits no discharge. No scleral icterus.  Neck: Neck supple. No thyromegaly present.  Cardiovascular: Normal rate and regular rhythm.   Pulmonary/Chest: Breath sounds normal. No accessory muscle usage. No tachypnea. No respiratory distress. She has no decreased breath sounds. She has no wheezes. She has no rhonchi. Right breast exhibits no inverted nipple, no mass, no nipple discharge and no tenderness (no axillary adenopathy). Left breast exhibits no inverted nipple, no mass, no nipple discharge and no tenderness (no axilarry adenopathy).  Abdominal: Soft. Bowel sounds are normal. There is no tenderness.  Musculoskeletal: She exhibits no edema or tenderness.  Lymphadenopathy:    She has no cervical adenopathy.  Neurological: She is alert and oriented to person, place, and time.  Skin: Skin is warm. No rash noted. No erythema.  Psychiatric: She has a normal mood and affect. Her behavior is normal.    BP 118/66 (BP Location: Left Arm, Patient Position: Sitting, Cuff Size: Normal)   Pulse 71   Temp 98.6 F (37 C) (Oral)   Resp 14   Ht 5' 3"  (1.6 m)   Wt 198 lb 12.8 oz (90.2 kg)   SpO2 96%   BMI 35.22 kg/m  Wt Readings from Last 3 Encounters:  07/15/17 198 lb 12.8 oz (90.2 kg)  05/13/17 200 lb (90.7 kg)  03/05/17 197 lb 6.4 oz (89.5 kg)     Lab Results  Component Value Date   WBC 9.0 02/28/2017   HGB 14.4 02/28/2017   HCT 41.9 02/28/2017   PLT 326.0 02/28/2017   GLUCOSE 108 (H) 07/11/2017   CHOL 218 (H) 07/11/2017   TRIG 147.0 07/11/2017   HDL 44.90 07/11/2017   LDLDIRECT 154.1 08/11/2013   LDLCALC 144 (H) 07/11/2017   ALT 12 07/11/2017   AST 12 07/11/2017   NA 139 07/11/2017   K 4.0 07/11/2017   CL 101 07/11/2017   CREATININE 0.82 07/11/2017   BUN 19 07/11/2017   CO2 31 07/11/2017   TSH 3.36 02/28/2017   HGBA1C 6.0 07/11/2017   MICROALBUR  <0.7 02/23/2015    Mm Screening Breast Tomo Bilateral  Result Date: 09/15/2015 CLINICAL DATA:  Screening. EXAM: DIGITAL SCREENING BILATERAL MAMMOGRAM WITH 3D TOMO WITH CAD COMPARISON:  Previous exam(s). ACR Breast Density Category b: There are scattered areas of fibroglandular density. FINDINGS: There are no findings suspicious for malignancy. Images were processed with CAD. IMPRESSION: No mammographic evidence of malignancy. A result letter of this screening mammogram will be mailed directly to the patient. RECOMMENDATION: Screening mammogram in one year. (Code:SM-B-01Y) BI-RADS CATEGORY  1: Negative. Electronically Signed   By: [name] M.D.   On: 09/15/2015 09:58       Assessment & Plan:   Problem List Items Addressed This Visit    Abnormal liver function test  Follow liver panel.        Health care maintenance    Physical today 07/15/17.  Colonoscopy 09/04/10      History of endometrial cancer    Has been followed by Dr Delsa Sale.  Plans to keep f/u.  Declines pelvic today.       Hypercholesterolemia    Low cholesterol diet and exercise.  Discussed calculated risk.  Discussed my desire to start a cholesterol medication.  She declines.  Follow lipid panel.       Relevant Medications   irbesartan-hydrochlorothiazide (AVALIDE) 150-12.5 MG tablet   hydrochlorothiazide (MICROZIDE) 12.5 MG capsule   Other Relevant Orders   Hepatic function panel   Lipid panel   Hyperglycemia    Low carb diet and exercise.  Follow met b and a1c.        Relevant Orders   Hemoglobin A1c   Hypertension    She recently added 12.53m hctz back to her regimen.  Blood pressures have been doing better.  Continue to follow blood pressure.  Follow metabolic panel.        Relevant Medications   irbesartan-hydrochlorothiazide (AVALIDE) 150-12.5 MG tablet   hydrochlorothiazide (MICROZIDE) 12.5 MG capsule   Other Relevant Orders   Basic metabolic panel   Obesity (BMI 30-39.9)    Discussed  diet and exercise.  Follow.       Sleep apnea    CPAP.        Other Visit Diagnoses    Routine general medical examination at a health care facility    -  Primary   Breast cancer screening       Relevant Orders   MM SCREENING BREAST TOMO BILATERAL       SEinar Pheasant MD

## 2017-07-16 LAB — HM HEPATITIS C SCREENING LAB: HM HEPATITIS C SCREENING: NEGATIVE

## 2017-07-18 ENCOUNTER — Encounter: Payer: Self-pay | Admitting: Internal Medicine

## 2017-07-18 NOTE — Assessment & Plan Note (Signed)
Low carb diet and exercise.  Follow met b and a1c.   

## 2017-07-18 NOTE — Assessment & Plan Note (Signed)
Follow liver panel.  

## 2017-07-18 NOTE — Assessment & Plan Note (Signed)
CPAP.  

## 2017-07-18 NOTE — Assessment & Plan Note (Signed)
Low cholesterol diet and exercise.  Discussed calculated risk.  Discussed my desire to start a cholesterol medication.  She declines.  Follow lipid panel.

## 2017-07-18 NOTE — Assessment & Plan Note (Signed)
She recently added 12.5mg  hctz back to her regimen.  Blood pressures have been doing better.  Continue to follow blood pressure.  Follow metabolic panel.

## 2017-07-18 NOTE — Assessment & Plan Note (Signed)
Discussed diet and exercise.  Follow.  

## 2017-07-18 NOTE — Assessment & Plan Note (Signed)
Has been followed by Dr Delsa Sale.  Plans to keep f/u.  Declines pelvic today.

## 2017-07-22 ENCOUNTER — Ambulatory Visit: Payer: Self-pay | Admitting: Family Medicine

## 2017-08-02 ENCOUNTER — Ambulatory Visit: Payer: Medicare HMO | Admitting: Family Medicine

## 2017-08-02 ENCOUNTER — Encounter: Payer: Self-pay | Admitting: Family Medicine

## 2017-08-02 VITALS — BP 126/80 | HR 69 | Ht 63.0 in | Wt 200.8 lb

## 2017-08-02 DIAGNOSIS — M533 Sacrococcygeal disorders, not elsewhere classified: Secondary | ICD-10-CM | POA: Diagnosis not present

## 2017-08-02 DIAGNOSIS — M999 Biomechanical lesion, unspecified: Secondary | ICD-10-CM

## 2017-08-02 NOTE — Patient Instructions (Signed)
Good to see you  Ice is your friend I am impressed Keep it up  Tennis ball between shoulder blades with sitting and monitor at eye level See me again in 10-12 weeks

## 2017-08-02 NOTE — Progress Notes (Signed)
[name] [address] [place], [place] [address] Phone: [phone] Subjective:    I'm seeing this patient by the request  of:    CC: Back and neck pain  URK:[id number]  Barbara Burns is a 71 y.o. female coming in for back pain. She is stiff due to a lot of sitting. She has been doing her exercises which help.  Still pain seems to be mostly in the left-sided area.  There is responding well to osteopathic manipulation previously.  No new symptoms just worsening than previous symptoms.    Past Medical History:  Diagnosis Date  . Chicken pox   . Endometrial cancer [facility])    s/p hysterectomy - 2003  . GERD (gastroesophageal reflux disease)   . Hypercholesterolemia   . Hypertension    Past Surgical History:  Procedure Laterality Date  . ABDOMINAL HYSTERECTOMY     total, may 2003  . APPENDECTOMY     age 69  . OOPHORECTOMY     left, secondary to ovarian cyst   Social History   Socioeconomic History  . Marital status: Married    Spouse name: None  . Number of children: None  . Years of education: None  . Highest education level: None  Social Needs  . Financial resource strain: None  . Food insecurity - worry: None  . Food insecurity - inability: None  . Transportation needs - medical: None  . Transportation needs - non-medical: None  Occupational History  . None  Tobacco Use  . Smoking status: Former Research scientist (life sciences)  . Smokeless tobacco: Never Used  Substance and Sexual Activity  . Alcohol use: No    Alcohol/week: 0.0 oz  . Drug use: No  . Sexual activity: No    Birth control/protection: Post-menopausal, Surgical  Other Topics Concern  . None  Social History Narrative  . None   Allergies  Allergen Reactions  . Erythromycin Nausea And Vomiting   Family History  Problem Relation Age of Onset  . Sleep apnea Father        resulting right heart failure  . Heart disease Father   . Hypertension Father   . Lymphoma Mother        non  hodgkins lymphoma  . Stroke Maternal Grandmother   . Alcohol abuse Maternal Grandfather   . Diabetes Paternal Grandmother      Past medical history, social, surgical and family history all reviewed in electronic medical record.  No pertanent information unless stated regarding to the chief complaint.   Review of Systems:Review of systems updated and as accurate  No headache, visual changes, nausea, vomiting, diarrhea, constipation, dizziness, abdominal pain, skin rash, fevers, chills, night sweats, weight loss, swollen lymph nodes, body aches, joint swelling, , chest pain, shortness of breath, mood changes.  Positive muscle aches  Objective  Blood pressure 126/80, pulse 69, height 5\' 3"  (1.6 m), weight 200 lb 12.8 oz (91.1 kg), SpO2 98 %.   General: No apparent distress alert and oriented x3 mood and affect normal, dressed appropriately.  HEENT: Pupils equal, extraocular movements intact  Respiratory: Patient's speak in full sentences and does not appear short of breath  Cardiovascular: No lower extremity edema, non tender, no erythema  Skin: Warm dry intact with no signs of infection or rash on extremities or on axial skeleton.  Abdomen: Soft nontender  Neuro: Cranial nerves II through XII are intact, neurovascularly intact in all extremities with 2+ DTRs and 2+ pulses.  Lymph: No  lymphadenopathy of posterior or anterior cervical chain or axillae bilaterally.  Gait normal with good balance and coordination.  MSK:  Non tender with full range of motion and good stability and symmetric strength and tone of shoulders, elbows, wrist, hip, knee and ankles bilaterally.  Back Exam:  Inspection: Unremarkable  Motion: Flexion 45 deg, Extension 25 deg, Side Bending to 35 deg bilaterally,  Rotation to 35 deg bilaterally  SLR laying: Negative  XSLR laying: Negative  Palpable tenderness: Pain to palpation over the left sacroiliac joint. FABER: Positive left. Sensory change: Gross sensation intact  to all lumbar and sacral dermatomes.  Reflexes: 2+ at both patellar tendons, 2+ at achilles tendons, Babinski's downgoing.  Strength at foot  Plantar-flexion: 5/5 Dorsi-flexion: 5/5 Eversion: 5/5 Inversion: 5/5  Leg strength  Quad: 5/5 Hamstring: 5/5 Hip flexor: 5/5 Hip abductors: 5/5  Gait unremarkable.  Osteopathic findings C2 flexed rotated and side bent right C4 flexed rotated and side bent left C7 flexed rotated and side bent left T3 extended rotated and side bent right inhaled third rib T7 extended rotated and side bent left L3 flexed rotated and side bent right Sacrum right on right     Impression and Recommendations:     This case required medical decision making of moderate complexity.      Note: This dictation was prepared with Dragon dictation along with smaller phrase technology. Any transcriptional errors that result from this process are unintentional.

## 2017-08-03 NOTE — Assessment & Plan Note (Signed)
Decision today to treat with OMT was based on Physical Exam  After verbal consent patient was treated with HVLA, ME, FPR techniques in cervical, thoracic, lumbar and sacral areas  Patient tolerated the procedure well with improvement in symptoms  Patient given exercises, stretches and lifestyle modifications  See medications in patient instructions if given  Patient will follow up in 1-2 weeks 

## 2017-08-03 NOTE — Assessment & Plan Note (Signed)
Continues to be uncomfortable for patient.  Continues to respond well low to conservative therapy.  Discussed again the importance of the hip abductor strengthening.  We discussed core strengthening watching for any type of instability.  Patient will start to increase activity slowly over the course the next weeks.  Follow-up again in 3 months

## 2017-08-05 ENCOUNTER — Encounter: Payer: Self-pay | Admitting: Internal Medicine

## 2017-08-12 ENCOUNTER — Ambulatory Visit: Payer: Self-pay

## 2017-09-19 ENCOUNTER — Ambulatory Visit
Admission: RE | Admit: 2017-09-19 | Discharge: 2017-09-19 | Disposition: A | Discharge status: Home / Self Care | Payer: Medicare HMO | Source: Ambulatory Visit | Attending: Internal Medicine | Admitting: Internal Medicine

## 2017-09-19 DIAGNOSIS — Z1231 Encounter for screening mammogram for malignant neoplasm of breast: Secondary | ICD-10-CM | POA: Insufficient documentation

## 2017-09-19 DIAGNOSIS — Z1239 Encounter for other screening for malignant neoplasm of breast: Secondary | ICD-10-CM

## 2017-09-22 ENCOUNTER — Encounter: Payer: Self-pay | Admitting: Family Medicine

## 2017-09-23 ENCOUNTER — Ambulatory Visit: Payer: Self-pay

## 2017-09-23 NOTE — Telephone Encounter (Signed)
Spoke with patient and scheduled her for Wed at 9:00. Patient would like medication called in for the next 2 days. Per a verbal from Dr. Tamala Julian he would recommend prednisone. Patient declines medication suggestion and will just keep appointment on Wednesday. Patient also asks what stretches she can do that will not cause pain. Recommended to only perform hamstring stretch lying supine.

## 2017-09-23 NOTE — Telephone Encounter (Signed)
   Answer Assessment - Initial Assessment Questions 1. ONSET: "When did the pain begin?"      Started Saturday 2. LOCATION: "Where does it hurt?" (upper, mid or lower back)     Mid - waist 3. SEVERITY: "How bad is the pain?"  (e.g., Scale 1-10; mild, moderate, or severe)   - MILD (1-3): doesn't interfere with normal activities    - MODERATE (4-7): interferes with normal activities or awakens from sleep    - SEVERE (8-10): excruciating pain, unable to do any normal activities      9 4. PATTERN: "Is the pain constant?" (e.g., yes, no; constant, intermittent)      Constant 5. RADIATION: "Does the pain shoot into your legs or elsewhere?"     No 6. CAUSE:  "What do you think is causing the back pain?"      Hip flexor  7. BACK OVERUSE:  "Any recent lifting of heavy objects, strenuous work or exercise?"     No 8. MEDICATIONS: "What have you taken so far for the pain?" (e.g., nothing, acetaminophen, NSAIDS)      Nothing helps 9. NEUROLOGIC SYMPTOMS: "Do you have any weakness, numbness, or problems with bowel/bladder control?"     No 10. OTHER SYMPTOMS: "Do you have any other symptoms?" (e.g., fever, abdominal pain, burning with urination, blood in urine)       No 11. PREGNANCY: "Is there any chance you are pregnant?" (e.g., yes, no; LMP)       No  Protocols used: BACK PAIN-A-AH  Reports "he does a manipulation that takes my pain away." Request to only see him.

## 2017-09-24 NOTE — Progress Notes (Signed)
[name] [address] [place], [place] [address] Phone: [phone] Subjective:    CC: Back pain follow-up  ZYS:[id number]  Barbara Burns is a 72 y.o. female coming in for pain in the left side of the thoracic spine. She is not able to sleep due to the stabbing pain. Her pain started on Saturday afternoon.  Patient states that it is tight overall.  Seems to be in the same location.  States though that has been 1 of the worst pain is recently.  More tightness than usual.     Past Medical History:  Diagnosis Date  . Chicken pox   . Endometrial cancer [facility])    s/p hysterectomy - 2003  . GERD (gastroesophageal reflux disease)   . Hypercholesterolemia   . Hypertension    Past Surgical History:  Procedure Laterality Date  . ABDOMINAL HYSTERECTOMY     total, may 2003  . APPENDECTOMY     age 85  . COLONOSCOPY WITH PROPOFOL N/A 05/20/2015   Procedure: COLONOSCOPY WITH PROPOFOL;  Surgeon: Manya Silvas, MD;  Location: [facility]COPY;  Service: Endoscopy;  Laterality: N/A;  . OOPHORECTOMY     left, secondary to ovarian cyst   Social History   Socioeconomic History  . Marital status: Married    Spouse name: Not on file  . Number of children: Not on file  . Years of education: Not on file  . Highest education level: Not on file  Social Needs  . Financial resource strain: Not on file  . Food insecurity - worry: Not on file  . Food insecurity - inability: Not on file  . Transportation needs - medical: Not on file  . Transportation needs - non-medical: Not on file  Occupational History  . Not on file  Tobacco Use  . Smoking status: Former Research scientist (life sciences)  . Smokeless tobacco: Never Used  Substance and Sexual Activity  . Alcohol use: No    Alcohol/week: 0.0 oz  . Drug use: No  . Sexual activity: No    Birth control/protection: Post-menopausal, Surgical  Other Topics Concern  . Not on file  Social History Narrative  . Not on file   Allergies    Allergen Reactions  . Erythromycin Nausea And Vomiting   Family History  Problem Relation Age of Onset  . Sleep apnea Father        resulting right heart failure  . Heart disease Father   . Hypertension Father   . Lymphoma Mother        non hodgkins lymphoma  . Stroke Maternal Grandmother   . Alcohol abuse Maternal Grandfather   . Diabetes Paternal Grandmother      Past medical history, social, surgical and family history all reviewed in electronic medical record.  No pertanent information unless stated regarding to the chief complaint.   Review of Systems:Review of systems updated and as accurate as of 09/25/17  No headache, visual changes, nausea, vomiting, diarrhea, constipation, dizziness, abdominal pain, skin rash, fevers, chills, night sweats, weight loss, swollen lymph nodes, body aches, joint swelling, chest pain, shortness of breath, mood changes.  Positive muscle aches  Objective  Blood pressure (!) 132/92, pulse 75, height 5\' 3"  (1.6 m), weight 200 lb (90.7 kg), SpO2 98 %. Systems examined below as of 09/25/17   General: No apparent distress alert and oriented x3 mood and affect normal, dressed appropriately.  HEENT: Pupils equal, extraocular movements intact  Respiratory: Patient's speak in full sentences and  does not appear short of breath  Cardiovascular: No lower extremity edema, non tender, no erythema  Skin: Warm dry intact with no signs of infection or rash on extremities or on axial skeleton.  Abdomen: Soft nontender  Neuro: Cranial nerves II through XII are intact, neurovascularly intact in all extremities with 2+ DTRs and 2+ pulses.  Lymph: No lymphadenopathy of posterior or anterior cervical chain or axillae bilaterally.  Gait normal with good balance and coordination.  MSK:  Non tender with full range of motion and good stability and symmetric strength and tone of shoulders, elbows, wrist, hip, knee and ankles bilaterally.  Back Exam:  Inspection:  Significant loss of lordosis of the lumbar spine Motion: Flexion 35 deg, Extension 25 deg, Side Bending to 35 deg bilaterally,  Rotation to 45 deg bilaterally  SLR laying: Negative  XSLR laying: Negative  Palpable tenderness: To palpation the paraspinal musculature lumbar spine right greater than left more at the thoracolumbar juncture. FABER: negative. Sensory change: Gross sensation intact to all lumbar and sacral dermatomes.  Reflexes: 2+ at both patellar tendons, 2+ at achilles tendons, Babinski's downgoing.  Strength at foot  Plantar-flexion: 5/5 Dorsi-flexion: 5/5 Eversion: 5/5 Inversion: 5/5  Leg strength  Quad: 5/5 Hamstring: 5/5 Hip flexor: 5/5 Hip abductors: 5/5  Gait unremarkable.  Osteopathic findings C5 flexed rotated and side bent right  T3 extended rotated and side bent right inhaled third rib T11 extended rotated and side bent left L2 flexed rotated and side bent right Sacrum right on right     Impression and Recommendations:     This case required medical decision making of moderate complexity.      Note: This dictation was prepared with Dragon dictation along with smaller phrase technology. Any transcriptional errors that result from this process are unintentional.

## 2017-09-25 ENCOUNTER — Ambulatory Visit: Payer: Medicare HMO | Admitting: Family Medicine

## 2017-09-25 ENCOUNTER — Encounter: Payer: Self-pay | Admitting: Family Medicine

## 2017-09-25 VITALS — BP 132/92 | HR 75 | Ht 63.0 in | Wt 200.0 lb

## 2017-09-25 DIAGNOSIS — M62830 Muscle spasm of back: Secondary | ICD-10-CM

## 2017-09-25 DIAGNOSIS — M999 Biomechanical lesion, unspecified: Secondary | ICD-10-CM

## 2017-09-25 MED ORDER — METHYLPREDNISOLONE ACETATE 80 MG/ML IJ SUSP
80.0000 mg | Freq: Once | INTRAMUSCULAR | Status: AC
  Administered 2017-09-25: 80 mg via INTRAMUSCULAR

## 2017-09-25 MED ORDER — PREDNISONE 50 MG PO TABS: 50.0000 mg | ORAL_TABLET | Freq: Every day | ORAL | 0 refills | Status: DC

## 2017-09-25 MED ORDER — KETOROLAC TROMETHAMINE 10 MG PO TABS: 10.0000 mg | ORAL_TABLET | Freq: Three times a day (TID) | ORAL | 0 refills | Status: DC | PRN

## 2017-09-25 NOTE — Assessment & Plan Note (Signed)
Decision today to treat with OMT was based on Physical Exam  After verbal consent patient was treated with HVLA, ME, FPR techniques in cervical, thoracic, rib, lumbar and sacral areas  Patient tolerated the procedure well with improvement in symptoms  Patient given exercises, stretches and lifestyle modifications  See medications in patient instructions if given  Patient will follow up in 12 weeks 

## 2017-09-25 NOTE — Patient Instructions (Addendum)
Good to see you  Ice 20 minutes 2 times daily. Usually after activity and before bed. Starting tomorrow toradol 2 times daily for 5 days. Watch out for stomach pain and do not use more then 5 days straight Prednisone daily for 5 days if that is not working  See me again in 7-10 days (OK to double book)

## 2017-09-25 NOTE — Assessment & Plan Note (Signed)
Worsening muscle spasm.  Responded well to osteopathic manipulation.  Given a shot of Depo-Medrol.  Given a short course of Toradol but warned of potential side effects.  We discussed icing regimen, home exercise, which activities to do which wants to avoid.

## 2017-09-29 ENCOUNTER — Encounter: Payer: Self-pay | Admitting: Family Medicine

## 2017-10-01 NOTE — Progress Notes (Signed)
[facility] [place], [place] [address] Phone: [phone] Subjective:     CC: Back pain follow-up  QQI:[id number]  Barbara Burns is a 72 y.o. female coming in with complaint of pain.  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  Was having exacerbation but did respond well to the prednisone.  Patient states doing much better at this time patient has been doing relatively well.       Past Medical History:  Diagnosis Date  . Chicken pox   . Endometrial cancer [facility])    s/p hysterectomy - 2003  . GERD (gastroesophageal reflux disease)   . Hypercholesterolemia   . Hypertension    Past Surgical History:  Procedure Laterality Date  . ABDOMINAL HYSTERECTOMY     total, may 2003  . APPENDECTOMY     age 58  . COLONOSCOPY WITH PROPOFOL N/A 05/20/2015   Procedure: COLONOSCOPY WITH PROPOFOL;  Surgeon: Manya Silvas, MD;  Location: [facility] ENDOSCOPY;  Service: Endoscopy;  Laterality: N/A;  . OOPHORECTOMY     left, secondary to ovarian cyst   Social History   Socioeconomic History  . Marital status: Married    Spouse name: Not on file  . Number of children: Not on file  . Years of education: Not on file  . Highest education level: Not on file  Social Needs  . Financial resource strain: Not on file  . Food insecurity - worry: Not on file  . Food insecurity - inability: Not on file  . Transportation needs - medical: Not on file  . Transportation needs - non-medical: Not on file  Occupational History  . Not on file  Tobacco Use  . Smoking status: Former Research scientist (life sciences)  . Smokeless tobacco: Never Used  Substance and Sexual Activity  . Alcohol use: No    Alcohol/week: 0.0 oz  . Drug use: No  . Sexual activity: No    Birth control/protection: Post-menopausal, Surgical  Other Topics Concern  . Not on file  Social History Narrative  . Not on file    Allergies  Allergen Reactions  . Erythromycin Nausea And Vomiting   Family History  Problem Relation Age of Onset  . Sleep apnea Father        resulting right heart failure  . Heart disease Father   . Hypertension Father   . Lymphoma Mother        non hodgkins lymphoma  . Stroke Maternal Grandmother   . Alcohol abuse Maternal Grandfather   . Diabetes Paternal Grandmother      Past medical history, social, surgical and family history all reviewed in electronic medical record.  No pertanent information unless stated regarding to the chief complaint.   Review of Systems:Review of systems updated and as accurate as of 10/01/17  No headache, visual changes, nausea, vomiting, diarrhea, constipation, dizziness, abdominal pain, skin rash, fevers, chills, night sweats, weight loss, swollen lymph nodes, body aches, joint swelling, muscle aches, chest pain, shortness of breath, mood changes.   Objective  There were no vitals taken for this visit. Systems examined below as of 10/01/17   General: No apparent distress alert and oriented x3 mood and affect normal, dressed appropriately.  HEENT: Pupils equal, extraocular movements intact  Respiratory: Patient's speak in full sentences and does not appear short of breath  Cardiovascular: No lower extremity edema, non tender, no erythema  Skin: Warm  dry intact with no signs of infection or rash on extremities or on axial skeleton.  Abdomen: Soft nontender  Neuro: Cranial nerves II through XII are intact, neurovascularly intact in all extremities with 2+ DTRs and 2+ pulses.  Lymph: No lymphadenopathy of posterior or anterior cervical chain or axillae bilaterally.  Gait normal with good balance and coordination.  MSK:  Non tender with full range of motion and good stability and symmetric strength and tone of shoulders, elbows, wrist, hip, knee and ankles bilaterally.  Back Exam:  Inspection: Mild loss of lordosis Motion: Flexion 45 deg,  Extension 15 deg, Side Bending to 45 deg bilaterally,  Rotation to 45 deg bilaterally  SLR laying: Negative  XSLR laying: Negative  Palpable tenderness: Some tenderness to palpation in the paraspinal musculature left spine more in the lumbosacral area. FABER: Tightness of the left. Sensory change: Gross sensation intact to all lumbar and sacral dermatomes.  Reflexes: 2+ at both patellar tendons, 2+ at achilles tendons, Babinski's downgoing.  Strength at foot  Plantar-flexion: 5/5 Dorsi-flexion: 5/5 Eversion: 5/5 Inversion: 5/5  Leg strength  Quad: 5/5 Hamstring: 5/5 Hip flexor: 5/5 Hip abductors: 5/5  Gait unremarkable.  Osteopathic findings C4 flexed rotated and side bent left C6 flexed rotated and side bent left T3 extended rotated and side bent right inhaled third rib T5 extended rotated and side bent left L2 flexed rotated and side bent right Sacrum left and left    Impression and Recommendations:     This case required medical decision making of moderate complexity.      Note: This dictation was prepared with Dragon dictation along with smaller phrase technology. Any transcriptional errors that result from this process are unintentional.

## 2017-10-02 ENCOUNTER — Encounter: Payer: Self-pay | Admitting: Family Medicine

## 2017-10-02 ENCOUNTER — Ambulatory Visit: Payer: Medicare HMO | Admitting: Family Medicine

## 2017-10-02 ENCOUNTER — Ambulatory Visit (INDEPENDENT_AMBULATORY_CARE_PROVIDER_SITE_OTHER)
Admission: RE | Admit: 2017-10-02 | Discharge: 2017-10-02 | Disposition: A | Discharge status: Home / Self Care | Payer: Self-pay | Source: Ambulatory Visit | Attending: Family Medicine | Admitting: Family Medicine

## 2017-10-02 VITALS — BP 158/90 | HR 69 | Ht 63.0 in | Wt 198.0 lb

## 2017-10-02 DIAGNOSIS — M533 Sacrococcygeal disorders, not elsewhere classified: Secondary | ICD-10-CM | POA: Diagnosis not present

## 2017-10-02 DIAGNOSIS — M549 Dorsalgia, unspecified: Secondary | ICD-10-CM | POA: Diagnosis not present

## 2017-10-02 DIAGNOSIS — G8929 Other chronic pain: Secondary | ICD-10-CM | POA: Diagnosis not present

## 2017-10-02 DIAGNOSIS — M999 Biomechanical lesion, unspecified: Secondary | ICD-10-CM

## 2017-10-02 DIAGNOSIS — M545 Low back pain: Secondary | ICD-10-CM | POA: Diagnosis not present

## 2017-10-02 NOTE — Patient Instructions (Signed)
Good to see you  Barbara Burns is your friend after working out  Stay active.  Xray downstairs See me again as scheduled.

## 2017-10-02 NOTE — Assessment & Plan Note (Signed)
Decision today to treat with OMT was based on Physical Exam  After verbal consent patient was treated with HVLA, ME, FPR techniques in cervical, thoracic, rib, lumbar and sacral areas  Patient tolerated the procedure well with improvement in symptoms  Patient given exercises, stretches and lifestyle modifications  See medications in patient instructions if given  Patient will follow up in 4 weeks 

## 2017-10-02 NOTE — Assessment & Plan Note (Signed)
Patient has been doing fairly well but has had muscle spasms in tightness for quite some time.  X-rays ordered today now that the symptoms have been going on for greater than a year.  Did have a very bad exacerbation last time.  We discussed icing regimen, home exercises and the importance of weight loss.  Patient will start to try to increase activity slowly.  Follow-up with me again in 4 weeks.

## 2017-10-16 ENCOUNTER — Encounter: Payer: Self-pay | Admitting: Internal Medicine

## 2017-10-16 ENCOUNTER — Other Ambulatory Visit (INDEPENDENT_AMBULATORY_CARE_PROVIDER_SITE_OTHER): Payer: Medicare HMO

## 2017-10-16 DIAGNOSIS — E7800 Pure hypercholesterolemia, unspecified: Secondary | ICD-10-CM | POA: Diagnosis not present

## 2017-10-16 DIAGNOSIS — R739 Hyperglycemia, unspecified: Secondary | ICD-10-CM | POA: Diagnosis not present

## 2017-10-16 DIAGNOSIS — I10 Essential (primary) hypertension: Secondary | ICD-10-CM

## 2017-10-16 LAB — HEPATIC FUNCTION PANEL
ALBUMIN: 4.3 g/dL (ref 3.5–5.2)
ALT: 13 U/L (ref 0–35)
AST: 12 U/L (ref 0–37)
Alkaline Phosphatase: 74 U/L (ref 39–117)
Bilirubin, Direct: 0.2 mg/dL (ref 0.0–0.3)
Total Bilirubin: 1.2 mg/dL (ref 0.2–1.2)
Total Protein: 7.5 g/dL (ref 6.0–8.3)

## 2017-10-16 LAB — LIPID PANEL
Cholesterol: 246 mg/dL — ABNORMAL HIGH (ref 0–200)
HDL: 59.2 mg/dL (ref >39.00)
LDL Cholesterol: 170 mg/dL — ABNORMAL HIGH (ref 0–99)
NONHDL: 186.49
TRIGLYCERIDES: 82 mg/dL (ref 0.0–149.0)
Total CHOL/HDL Ratio: 4
VLDL: 16.4 mg/dL (ref 0.0–40.0)

## 2017-10-16 LAB — BASIC METABOLIC PANEL
BUN: 18 mg/dL (ref 6–23)
CO2: 27 meq/L (ref 19–32)
Calcium: 9.7 mg/dL (ref 8.4–10.5)
Chloride: 101 mEq/L (ref 96–112)
Creatinine, Ser: 0.94 mg/dL (ref 0.40–1.20)
GFR: 62.52 mL/min (ref >60.00)
GLUCOSE: 123 mg/dL — AB (ref 70–99)
POTASSIUM: 3.9 meq/L (ref 3.5–5.1)
Sodium: 141 mEq/L (ref 135–145)

## 2017-10-16 LAB — HEMOGLOBIN A1C: Hgb A1c MFr Bld: 6.1 % (ref 4.6–6.5)

## 2017-10-18 ENCOUNTER — Encounter: Payer: Self-pay | Admitting: Internal Medicine

## 2017-10-18 ENCOUNTER — Ambulatory Visit: Payer: Medicare HMO | Admitting: Internal Medicine

## 2017-10-18 DIAGNOSIS — R945 Abnormal results of liver function studies: Secondary | ICD-10-CM | POA: Diagnosis not present

## 2017-10-18 DIAGNOSIS — E7800 Pure hypercholesterolemia, unspecified: Secondary | ICD-10-CM

## 2017-10-18 DIAGNOSIS — I10 Essential (primary) hypertension: Secondary | ICD-10-CM | POA: Diagnosis not present

## 2017-10-18 DIAGNOSIS — R739 Hyperglycemia, unspecified: Secondary | ICD-10-CM

## 2017-10-18 DIAGNOSIS — Z8542 Personal history of malignant neoplasm of other parts of uterus: Secondary | ICD-10-CM

## 2017-10-18 DIAGNOSIS — G4730 Sleep apnea, unspecified: Secondary | ICD-10-CM

## 2017-10-18 DIAGNOSIS — R7989 Other specified abnormal findings of blood chemistry: Secondary | ICD-10-CM

## 2017-10-18 DIAGNOSIS — Z8249 Family history of ischemic heart disease and other diseases of the circulatory system: Secondary | ICD-10-CM | POA: Diagnosis not present

## 2017-10-18 DIAGNOSIS — E669 Obesity, unspecified: Secondary | ICD-10-CM

## 2017-10-18 NOTE — Progress Notes (Signed)
Pre-visit discussion using our clinic review tool. No additional management support is needed unless otherwise documented below in the visit note.  

## 2017-10-18 NOTE — Patient Instructions (Addendum)
Aortic ultrasound:  Diagnosis code is Z82.49

## 2017-10-18 NOTE — Progress Notes (Signed)
Patient ID: Barbara Burns, female   DOB: 1946-04-18, 72 y.o.   MRN: [id number]   Subjective:    Patient ID: Barbara Burns, female    DOB: Sep 23, 1946, 72 y.o.   MRN: [id number]  HPI  Patient here for a scheduled follow up.  She reports she is doing relatively well.  Has been seeing Dr Tamala Julian for back pain.  Xray with aortic atherosclerosis and DDD.  Is exercising.  Better.  Has f/u planned with Dr Tamala Julian.  No chest pain.  No sob.  No acid reflux.  No abdominal pain.  Bowels moving.  No urine change.  Handling stress.     Past Medical History:  Diagnosis Date  . Chicken pox   . Endometrial cancer [facility])    s/p hysterectomy - 2003  . GERD (gastroesophageal reflux disease)   . Hypercholesterolemia   . Hypertension    Past Surgical History:  Procedure Laterality Date  . ABDOMINAL HYSTERECTOMY     total, may 2003  . APPENDECTOMY     age 35  . COLONOSCOPY WITH PROPOFOL N/A 05/20/2015   Procedure: COLONOSCOPY WITH PROPOFOL;  Surgeon: Manya Silvas, MD;  Location: [facility] ENDOSCOPY;  Service: Endoscopy;  Laterality: N/A;  . OOPHORECTOMY     left, secondary to ovarian cyst   Family History  Problem Relation Age of Onset  . Sleep apnea Father        resulting right heart failure  . Heart disease Father   . Hypertension Father   . Lymphoma Mother        non hodgkins lymphoma  . Stroke Maternal Grandmother   . Alcohol abuse Maternal Grandfather   . Diabetes Paternal Grandmother    Social History   Socioeconomic History  . Marital status: Married    Spouse name: None  . Number of children: None  . Years of education: None  . Highest education level: None  Social Needs  . Financial resource strain: None  . Food insecurity - worry: None  . Food insecurity - inability: None  . Transportation needs - medical: None  . Transportation needs - non-medical: None  Occupational History  . None  Tobacco Use  . Smoking status: Former Research scientist (life sciences)  . Smokeless tobacco: Never Used    Substance and Sexual Activity  . Alcohol use: No    Alcohol/week: 0.0 oz  . Drug use: No  . Sexual activity: No    Birth control/protection: Post-menopausal, Surgical  Other Topics Concern  . None  Social History Narrative  . None    Outpatient Encounter Medications as of 10/18/2017  Medication Sig  . aspirin 81 MG tablet Take 81 mg by mouth daily.  . Calcium Carbonate (CALCIUM 600 PO) Take 1 tablet by mouth 2 (two) times daily.  . Cholecalciferol (VITAMIN D3) 2000 UNITS TABS Take 2,000 Units by mouth daily.  Marland Kitchen co-enzyme Q-10 50 MG capsule Take 50 mg by mouth daily.  . fish oil-omega-3 fatty acids 1000 MG capsule Take 1 g by mouth daily.  . hydrochlorothiazide (MICROZIDE) 12.5 MG capsule Take 1 capsule (12.5 mg total) by mouth daily.  . irbesartan-hydrochlorothiazide (AVALIDE) 150-12.5 MG tablet Take 1 tablet by mouth daily.  Marland Kitchen ketorolac (TORADOL) 10 MG tablet Take 1 tablet (10 mg total) by mouth every 8 (eight) hours as needed.  Marland Kitchen omeprazole (PRILOSEC) 20 MG capsule Take 20 mg by mouth daily.  . Turmeric 500 MG CAPS Take 1 capsule by mouth daily.  . [DISCONTINUED] predniSONE (DELTASONE) 50 MG  tablet Take 1 tablet (50 mg total) by mouth daily.   No facility-administered encounter medications on file as of 10/18/2017.     Review of Systems  Constitutional: Negative for appetite change and unexpected weight change.  HENT: Negative for congestion and sinus pressure.   Respiratory: Negative for cough, chest tightness and shortness of breath.   Cardiovascular: Negative for chest pain, palpitations and leg swelling.  Gastrointestinal: Negative for abdominal pain, diarrhea, nausea and vomiting.  Genitourinary: Negative for difficulty urinating and dysuria.  Musculoskeletal: Negative for myalgias.       Back pain as outlined.  Better.  Seeing Dr Tamala Julian.    Skin: Negative for color change and rash.  Neurological: Negative for dizziness, light-headedness and headaches.   Psychiatric/Behavioral: Negative for agitation and dysphoric mood.       Objective:    Physical Exam  Constitutional: She appears well-developed and well-nourished. No distress.  HENT:  Nose: Nose normal.  Mouth/Throat: Oropharynx is clear and moist.  Neck: Neck supple. No thyromegaly present.  Cardiovascular: Normal rate and regular rhythm.  Pulmonary/Chest: Breath sounds normal. No respiratory distress. She has no wheezes.  Abdominal: Soft. Bowel sounds are normal. There is no tenderness.  Musculoskeletal: She exhibits no edema or tenderness.  Lymphadenopathy:    She has no cervical adenopathy.  Skin: No rash noted. No erythema.  Psychiatric: She has a normal mood and affect. Her behavior is normal.    BP 118/70 (BP Location: Left Arm, Patient Position: Sitting, Cuff Size: Normal)   Pulse 73   Temp 98.6 F (37 C) (Oral)   Resp 18   Ht 5' 2.75" (1.594 m)   Wt 196 lb (88.9 kg)   SpO2 97%   BMI 35.00 kg/m  Wt Readings from Last 3 Encounters:  10/18/17 196 lb (88.9 kg)  10/02/17 198 lb (89.8 kg)  09/25/17 200 lb (90.7 kg)     Lab Results  Component Value Date   WBC 9.0 02/28/2017   HGB 14.4 02/28/2017   HCT 41.9 02/28/2017   PLT 326.0 02/28/2017   GLUCOSE 123 (H) 10/16/2017   CHOL 246 (H) 10/16/2017   TRIG 82.0 10/16/2017   HDL 59.20 10/16/2017   LDLDIRECT 154.1 08/11/2013   LDLCALC 170 (H) 10/16/2017   ALT 13 10/16/2017   AST 12 10/16/2017   NA 141 10/16/2017   K 3.9 10/16/2017   CL 101 10/16/2017   CREATININE 0.94 10/16/2017   BUN 18 10/16/2017   CO2 27 10/16/2017   TSH 3.36 02/28/2017   HGBA1C 6.1 10/16/2017   MICROALBUR <0.7 02/23/2015    Dg Lumbar Spine Complete  Result Date: 10/03/2017 CLINICAL DATA:  Back pain.  No known injury. EXAM: LUMBAR SPINE - COMPLETE 4+ VIEW COMPARISON:  No recent. FINDINGS: Lumbar spine numbered with the lowest segmented appearing lumbar shaped vertebra as L5. Diffuse multilevel degenerative change. Degenerative  changes most prominent at L1-L2. No acute bony abnormality. Aortic atherosclerotic vascular calcification. Surgical clips in the pelvis. IMPRESSION: 1. Diffuse multilevel degenerative change. Degenerative changes most prominent at L1-L2. No acute abnormality identified. 2.  Aortic atherosclerotic vascular disease. Electronically Signed   By: Marcello Moores  Register   On: 10/03/2017 06:43       Assessment & Plan:   Problem List Items Addressed This Visit    Abnormal liver function test    Diet and exercise.  Follow liver panel.        Family history of aortic aneurysm    Given information to check with insurance  about aortic aneurysm.        History of endometrial cancer    Has been followed by Dr Delsa Sale.        Hypercholesterolemia    Low cholesterol diet and exercise.  Discussed recent labs and calculated cholesterol risk.  She declines cholesterol medication. Discussed importance of treating.  She declines.  Follow lipid panel.       Relevant Orders   Hepatic function panel   Lipid panel   Hyperglycemia    Low carb diet and exercise.  Follow met b and a1c.        Relevant Orders   Hemoglobin A1c   Hypertension    Blood pressure under good control.  Continue same medication regimen.  Follow pressures.  Follow metabolic panel.        Relevant Orders   Basic metabolic panel   Obesity (BMI 30-39.9)    Diet and exercise.  Follow.       Sleep apnea    CPAP.           Einar Pheasant, MD

## 2017-10-20 ENCOUNTER — Encounter: Payer: Self-pay | Admitting: Internal Medicine

## 2017-10-20 NOTE — Assessment & Plan Note (Signed)
Diet and exercise.  Follow.  

## 2017-10-20 NOTE — Assessment & Plan Note (Signed)
Has been followed by Dr Delsa Sale.

## 2017-10-20 NOTE — Assessment & Plan Note (Signed)
Low cholesterol diet and exercise.  Discussed recent labs and calculated cholesterol risk.  She declines cholesterol medication. Discussed importance of treating.  She declines.  Follow lipid panel.

## 2017-10-20 NOTE — Assessment & Plan Note (Signed)
Diet and exercise.  Follow liver panel.   

## 2017-10-20 NOTE — Assessment & Plan Note (Signed)
Blood pressure under good control.  Continue same medication regimen.  Follow pressures.  Follow metabolic panel.   

## 2017-10-20 NOTE — Assessment & Plan Note (Signed)
CPAP.  

## 2017-10-20 NOTE — Assessment & Plan Note (Signed)
Low carb diet and exercise.  Follow met b and a1c.   

## 2017-10-20 NOTE — Assessment & Plan Note (Signed)
Given information to check with insurance about aortic aneurysm.

## 2017-10-27 NOTE — Progress Notes (Signed)
[facility] [place], [place] [address] Phone: [phone] Subjective:    I'm seeing this patient by the request  of:    CC: Back pain follow-up  [id number]  Barbara Burns is a 72 y.o. female coming in with complaint of back pain.  Patient was having worsening back pain.  Patient did get x-rays at last exam.  These were independently visualized by me.  Diffuse multilevel degenerative changes most prominent at L1-L2.  Patient did have some atherosclerosis and vascular calcifications.  Patient was to go back to her regular schedule.  Has been responding very well to osteopathic manipulation.  Patient states that she is doing well. She has been exercising in class and walking. She also has been feeling good when sitting for prolonged periods of time.      Past Medical History:  Diagnosis Date  . Chicken pox   . Endometrial cancer [facility])    s/p hysterectomy - 2003  . GERD (gastroesophageal reflux disease)   . Hypercholesterolemia   . Hypertension    Past Surgical History:  Procedure Laterality Date  . ABDOMINAL HYSTERECTOMY     total, may 2003  . APPENDECTOMY     age 51  . COLONOSCOPY WITH PROPOFOL N/A 05/20/2015   Procedure: COLONOSCOPY WITH PROPOFOL;  Surgeon: Manya Silvas, MD;  Location[facility] ENDOSCOPY;  Service: Endoscopy;  Laterality: N/A;  . OOPHORECTOMY     left, secondary to ovarian cyst   Social History   Socioeconomic History  . Marital status: Married    Spouse name: None  . Number of children: None  . Years of education: None  . Highest education level: None  Social Needs  . Financial resource strain: None  . Food insecurity - worry: None  . Food insecurity - inability: None  . Transportation needs - medical: None  . Transportation needs - non-medical: None  Occupational History  . None  Tobacco Use  . Smoking status: Former Research scientist (life sciences)  . Smokeless tobacco: Never Used  Substance and Sexual Activity  .  Alcohol use: No    Alcohol/week: 0.0 oz  . Drug use: No  . Sexual activity: No    Birth control/protection: Post-menopausal, Surgical  Other Topics Concern  . None  Social History Narrative  . None   Allergies  Allergen Reactions  . Erythromycin Nausea And Vomiting   Family History  Problem Relation Age of Onset  . Sleep apnea Father        resulting right heart failure  . Heart disease Father   . Hypertension Father   . Lymphoma Mother        non hodgkins lymphoma  . Stroke Maternal Grandmother   . Alcohol abuse Maternal Grandfather   . Diabetes Paternal Grandmother      Past medical history, social, surgical and family history all reviewed in electronic medical record.  No pertanent information unless stated regarding to the chief complaint.   Review of Systems:Review of systems updated and as accurate as of 10/28/17  No headache, visual changes, nausea, vomiting, diarrhea, constipation, dizziness, abdominal pain, skin rash, fevers, chills, night sweats, weight loss, swollen lymph nodes, body aches, joint swelling,  chest pain, shortness of breath, mood changes.  Mild positive muscle aches  Objective  Blood pressure 120/84, pulse 63, height 5' 2.75" (1.594 m), weight 197 lb (89.4 kg), SpO2 98 %. Systems examined below as of 10/28/17   General: No apparent distress alert and oriented x3  mood and affect normal, dressed appropriately.  HEENT: Pupils equal, extraocular movements intact  Respiratory: Patient's speak in full sentences and does not appear short of breath  Cardiovascular: No lower extremity edema, non tender, no erythema  Skin: Warm dry intact with no signs of infection or rash on extremities or on axial skeleton.  Abdomen: Soft nontender  Neuro: Cranial nerves II through XII are intact, neurovascularly intact in all extremities with 2+ DTRs and 2+ pulses.  Lymph: No lymphadenopathy of posterior or anterior cervical chain or axillae bilaterally.  Gait normal  with good balance and coordination.  MSK:  Non tender with full range of motion and good stability and symmetric strength and tone of shoulders, elbows, wrist, hip, knee and ankles bilaterally.  Back Exam:  Inspection: Mild loss of lordosis Motion: Flexion 40 deg, Extension 25 deg, Side Bending to 20 deg bilaterally,  Rotation to 35 deg bilaterally  SLR laying: Negative  XSLR laying: Negative  Palpable tenderness: Tender to palpation in the paraspinal musculature lumbar spine. FABER: Mild tightness of the right. Sensory change: Gross sensation intact to all lumbar and sacral dermatomes.  Reflexes: 2+ at both patellar tendons, 2+ at achilles tendons, Babinski's downgoing.  Strength at foot  Plantar-flexion: 5/5 Dorsi-flexion: 5/5 Eversion: 5/5 Inversion: 5/5  Leg strength  Quad: 5/5 Hamstring: 5/5 Hip flexor: 5/5 Hip abductors: 5/5  Gait unremarkable.  Osteopathic findings C2 flexed rotated and side bent right C4 flexed rotated and side bent left T3 extended rotated and side bent right inhaled third rib T7 extended rotated and side bent left L3 flexed rotated and side bent right Sacrum right on right    Impression and Recommendations:     This case required medical decision making of moderate complexity.      Note: This dictation was prepared with Dragon dictation along with smaller phrase technology. Any transcriptional errors that result from this process are unintentional.

## 2017-10-28 ENCOUNTER — Ambulatory Visit: Payer: Medicare HMO | Admitting: Family Medicine

## 2017-10-28 ENCOUNTER — Encounter: Payer: Self-pay | Admitting: Family Medicine

## 2017-10-28 VITALS — BP 120/84 | HR 63 | Ht 62.75 in | Wt 197.0 lb

## 2017-10-28 DIAGNOSIS — M999 Biomechanical lesion, unspecified: Secondary | ICD-10-CM | POA: Diagnosis not present

## 2017-10-28 DIAGNOSIS — M533 Sacrococcygeal disorders, not elsewhere classified: Secondary | ICD-10-CM

## 2017-10-28 NOTE — Assessment & Plan Note (Signed)
Continues to have some mild pain.  Has insurance and.  Patient is doing very well.  Do not feel that advanced imaging is warranted at this time.  Patient will be.  Follow-up with me again in 4-8 weeks

## 2017-10-28 NOTE — Assessment & Plan Note (Signed)
Decision today to treat with OMT was based on Physical Exam  After verbal consent patient was treated with HVLA, ME, FPR techniques in cervical, thoracic, lumbar and sacral areas  Patient tolerated the procedure well with improvement in symptoms  Patient given exercises, stretches and lifestyle modifications  See medications in patient instructions if given  Patient will follow up in 8 weeks 

## 2017-10-28 NOTE — Patient Instructions (Signed)
Awesome!!! You are dong great  Keep it up  See me again in 2 months.

## 2017-12-28 NOTE — Progress Notes (Signed)
[name] [address] [place], [place] [address] Phone: [phone] Subjective:    I'm seeing this patient by the request  of:    CC: Back pain follow-up  [id number]  Barbara Burns is a 72 y.o. female coming in with complaint of back pain.  Describes pain as a dull, throbbing aching sensation.  Nothing that is stopping her from any activities.  Still uncomfortable.  Patient rates the severity of pain is 6 out of 10.       Past Medical History:  Diagnosis Date  . Chicken pox   . Endometrial cancer [facility])    s/p hysterectomy - 2003  . GERD (gastroesophageal reflux disease)   . Hypercholesterolemia   . Hypertension    Past Surgical History:  Procedure Laterality Date  . ABDOMINAL HYSTERECTOMY     total, may 2003  . APPENDECTOMY     age 3  . COLONOSCOPY WITH PROPOFOL N/A 05/20/2015   Procedure: COLONOSCOPY WITH PROPOFOL;  Surgeon: Manya Silvas, MD;  Location: [facility] ENDOSCOPY;  Service: Endoscopy;  Laterality: N/A;  . OOPHORECTOMY     left, secondary to ovarian cyst   Social History   Socioeconomic History  . Marital status: Married    Spouse name: Not on file  . Number of children: Not on file  . Years of education: Not on file  . Highest education level: Not on file  Occupational History  . Not on file  Social Needs  . Financial resource strain: Not on file  . Food insecurity:    Worry: Not on file    Inability: Not on file  . Transportation needs:    Medical: Not on file    Non-medical: Not on file  Tobacco Use  . Smoking status: Former Research scientist (life sciences)  . Smokeless tobacco: Never Used  Substance and Sexual Activity  . Alcohol use: No    Alcohol/week: 0.0 oz  . Drug use: No  . Sexual activity: Never    Birth control/protection: Post-menopausal, Surgical  Lifestyle  . Physical activity:    Days per week: Not on file    Minutes per session: Not on file  . Stress: Not on file  Relationships  . Social connections:   Talks on phone: Not on file    Gets together: Not on file    Attends religious service: Not on file    Active member of club or organization: Not on file    Attends meetings of clubs or organizations: Not on file    Relationship status: Not on file  Other Topics Concern  . Not on file  Social History Narrative  . Not on file   Allergies  Allergen Reactions  . Erythromycin Nausea And Vomiting   Family History  Problem Relation Age of Onset  . Sleep apnea Father        resulting right heart failure  . Heart disease Father   . Hypertension Father   . Lymphoma Mother        non hodgkins lymphoma  . Stroke Maternal Grandmother   . Alcohol abuse Maternal Grandfather   . Diabetes Paternal Grandmother      Past medical history, social, surgical and family history all reviewed in electronic medical record.  No pertanent information unless stated regarding to the chief complaint.   Review of Systems:Review of systems updated and as accurate as of 12/30/17  No headache, visual changes, nausea, vomiting, diarrhea, constipation, dizziness, abdominal pain, skin rash, fevers,  chills, night sweats, weight loss, swollen lymph nodes, body aches, joint swelling, muscle aches, chest pain, shortness of breath, mood changes.  Positive muscle aches  Objective  Blood pressure 138/84, pulse 68, height 5\' 2"  (1.575 m), weight 200 lb (90.7 kg), SpO2 97 %. Systems examined below as of 12/30/17   General: No apparent distress alert and oriented x3 mood and affect normal, dressed appropriately.  HEENT: Pupils equal, extraocular movements intact  Respiratory: Patient's speak in full sentences and does not appear short of breath  Cardiovascular: No lower extremity edema, non tender, no erythema  Skin: Warm dry intact with no signs of infection or rash on extremities or on axial skeleton.  Abdomen: Soft nontender  Neuro: Cranial nerves II through XII are intact, neurovascularly intact in all extremities  with 2+ DTRs and 2+ pulses.  Lymph: No lymphadenopathy of posterior or anterior cervical chain or axillae bilaterally.  Gait normal with good balance and coordination.  MSK:  Non tender with full range of motion and good stability and symmetric strength and tone of shoulders, elbows, wrist, hip, knee and ankles bilaterally.   Back Exam:  Inspection: Unremarkable  Motion: Flexion 45 deg, Extension 25 deg, Side Bending to 35 deg bilaterally,  Rotation to 35 deg bilaterally  SLR laying: Negative  XSLR laying: Negative  Palpable tenderness: Tender to palpation in the paraspinal musculature mostly in the thoracolumbar junction. FABER: Tightness bilaterally. Sensory change: Gross sensation intact to all lumbar and sacral dermatomes.  Reflexes: 2+ at both patellar tendons, 2+ at achilles tendons, Babinski's downgoing.  Strength at foot  Plantar-flexion: 5/5 Dorsi-flexion: 5/5 Eversion: 5/5 Inversion: 5/5  Leg strength  Quad: 5/5 Hamstring: 5/5 Hip flexor: 5/5 Hip abductors: 5/5  Gait unremarkable.  Osteopathic findings C2 flexed rotated and side bent right T3 extended rotated and side bent right inhaled third rib T5 extended rotated and side bent left L2 flexed rotated and side bent right Sacrum right on right     Impression and Recommendations:     This case required medical decision making of moderate complexity.      Note: This dictation was prepared with Dragon dictation along with smaller phrase technology. Any transcriptional errors that result from this process are unintentional.

## 2017-12-30 ENCOUNTER — Encounter: Payer: Self-pay | Admitting: Family Medicine

## 2017-12-30 ENCOUNTER — Ambulatory Visit: Payer: Medicare HMO | Admitting: Family Medicine

## 2017-12-30 VITALS — BP 138/84 | HR 68 | Ht 62.0 in | Wt 200.0 lb

## 2017-12-30 DIAGNOSIS — M533 Sacrococcygeal disorders, not elsewhere classified: Secondary | ICD-10-CM

## 2017-12-30 DIAGNOSIS — M999 Biomechanical lesion, unspecified: Secondary | ICD-10-CM | POA: Diagnosis not present

## 2017-12-30 NOTE — Assessment & Plan Note (Signed)
Decision today to treat with OMT was based on Physical Exam  After verbal consent patient was treated with HVLA, ME, FPR techniques in cervical, thoracic, lumbar and sacral areas  Patient tolerated the procedure well with improvement in symptoms  Patient given exercises, stretches and lifestyle modifications  See medications in patient instructions if given  Patient will follow up in 8 weeks 

## 2017-12-30 NOTE — Patient Instructions (Signed)
Good to see you  Barbara Burns is your friend.  Stay active.  Iron 65mg  with 500mg  of vitamin C at least 3 times a week  75-100g of protein daily  myfitnesspal for 3 days Keep doing everything else See me again in 2  Months

## 2017-12-30 NOTE — Assessment & Plan Note (Signed)
Stable.  We discussed different diet changes could be beneficial.  Discussed ergonomics throughout the day.  Patient is working on a regular basis.  Follow-up with me again in 2 months

## 2018-02-13 ENCOUNTER — Other Ambulatory Visit: Payer: Self-pay | Admitting: Internal Medicine

## 2018-02-15 ENCOUNTER — Encounter: Payer: Self-pay | Admitting: Internal Medicine

## 2018-02-15 DIAGNOSIS — E611 Iron deficiency: Secondary | ICD-10-CM

## 2018-02-18 NOTE — Telephone Encounter (Signed)
Order placed for cbc and iron studies.   

## 2018-02-19 ENCOUNTER — Other Ambulatory Visit (INDEPENDENT_AMBULATORY_CARE_PROVIDER_SITE_OTHER): Payer: Medicare HMO

## 2018-02-19 DIAGNOSIS — E7800 Pure hypercholesterolemia, unspecified: Secondary | ICD-10-CM | POA: Diagnosis not present

## 2018-02-19 DIAGNOSIS — I10 Essential (primary) hypertension: Secondary | ICD-10-CM | POA: Diagnosis not present

## 2018-02-19 DIAGNOSIS — R739 Hyperglycemia, unspecified: Secondary | ICD-10-CM | POA: Diagnosis not present

## 2018-02-19 DIAGNOSIS — E611 Iron deficiency: Secondary | ICD-10-CM | POA: Diagnosis not present

## 2018-02-19 LAB — LIPID PANEL
CHOLESTEROL: 212 mg/dL — AB (ref 0–200)
HDL: 41.3 mg/dL (ref >39.00)
LDL Cholesterol: 138 mg/dL — ABNORMAL HIGH (ref 0–99)
NonHDL: 170.81
TRIGLYCERIDES: 166 mg/dL — AB (ref 0.0–149.0)
Total CHOL/HDL Ratio: 5
VLDL: 33.2 mg/dL (ref 0.0–40.0)

## 2018-02-19 LAB — HEPATIC FUNCTION PANEL
ALT: 10 U/L (ref 0–35)
AST: 10 U/L (ref 0–37)
Albumin: 4.2 g/dL (ref 3.5–5.2)
Alkaline Phosphatase: 69 U/L (ref 39–117)
BILIRUBIN DIRECT: 0.1 mg/dL (ref 0.0–0.3)
BILIRUBIN TOTAL: 0.7 mg/dL (ref 0.2–1.2)
TOTAL PROTEIN: 7.2 g/dL (ref 6.0–8.3)

## 2018-02-19 LAB — BASIC METABOLIC PANEL
BUN: 14 mg/dL (ref 6–23)
CO2: 31 mEq/L (ref 19–32)
Calcium: 9.6 mg/dL (ref 8.4–10.5)
Chloride: 103 mEq/L (ref 96–112)
Creatinine, Ser: 0.82 mg/dL (ref 0.40–1.20)
GFR: 73.12 mL/min (ref >60.00)
Glucose, Bld: 122 mg/dL — ABNORMAL HIGH (ref 70–99)
POTASSIUM: 4.6 meq/L (ref 3.5–5.1)
Sodium: 140 mEq/L (ref 135–145)

## 2018-02-19 LAB — CBC WITH DIFFERENTIAL/PLATELET
BASOS ABS: 0.1 10*3/uL (ref 0.0–0.1)
BASOS PCT: 1.2 % (ref 0.0–3.0)
EOS ABS: 0.3 10*3/uL (ref 0.0–0.7)
Eosinophils Relative: 4.1 % (ref 0.0–5.0)
HEMATOCRIT: 42.6 % (ref 36.0–46.0)
Hemoglobin: 14.3 g/dL (ref 12.0–15.0)
LYMPHS ABS: 3 10*3/uL (ref 0.7–4.0)
LYMPHS PCT: 39 % (ref 12.0–46.0)
MCHC: 33.5 g/dL (ref 30.0–36.0)
MCV: 88.7 fl (ref 78.0–100.0)
MONO ABS: 0.7 10*3/uL (ref 0.1–1.0)
Monocytes Relative: 9.4 % (ref 3.0–12.0)
NEUTROS ABS: 3.5 10*3/uL (ref 1.4–7.7)
NEUTROS PCT: 46.3 % (ref 43.0–77.0)
PLATELETS: 317 10*3/uL (ref 150.0–400.0)
RBC: 4.8 Mil/uL (ref 3.87–5.11)
RDW: 12.9 % (ref 11.5–15.5)
WBC: 7.6 10*3/uL (ref 4.0–10.5)

## 2018-02-19 LAB — IBC PANEL
IRON: 51 ug/dL (ref 42–145)
SATURATION RATIOS: 13.7 % — AB (ref 20.0–50.0)
Transferrin: 266 mg/dL (ref 212.0–360.0)

## 2018-02-19 LAB — HEMOGLOBIN A1C: HEMOGLOBIN A1C: 6 % (ref 4.6–6.5)

## 2018-02-19 LAB — FERRITIN: Ferritin: 31.3 ng/mL (ref 10.0–291.0)

## 2018-02-21 ENCOUNTER — Ambulatory Visit: Payer: Self-pay | Admitting: Internal Medicine

## 2018-02-23 ENCOUNTER — Encounter: Payer: Self-pay | Admitting: Internal Medicine

## 2018-03-10 ENCOUNTER — Ambulatory Visit: Payer: Self-pay | Admitting: Family Medicine

## 2018-03-18 NOTE — Progress Notes (Signed)
[name] [address] [place], [place] [address] Phone: [phone] Subjective:    CC: Back and neck pain  [id number]  Barbara Burns is a 72 y.o. female coming in with complaint of back and neck pain patient we have seen multiple times patient overall is doing relatively well.  Still has some aches and pains but nothing severe.  No radiation down the legs or any numbness or tingling.  No worsening of the back pain.  Has had a endometrial cancer status post hysterectomy.  No fevers chills or any abnormal weight loss     Past Medical History:  Diagnosis Date  . Chicken pox   . Endometrial cancer [facility])    s/p hysterectomy - 2003  . GERD (gastroesophageal reflux disease)   . Hypercholesterolemia   . Hypertension    Past Surgical History:  Procedure Laterality Date  . ABDOMINAL HYSTERECTOMY     total, may 2003  . APPENDECTOMY     age 12  . COLONOSCOPY WITH PROPOFOL N/A 05/20/2015   Procedure: COLONOSCOPY WITH PROPOFOL;  Surgeon: Manya Silvas, MD;  Location: [facility] ENDOSCOPY;  Service: Endoscopy;  Laterality: N/A;  . OOPHORECTOMY     left, secondary to ovarian cyst   Social History   Socioeconomic History  . Marital status: Married    Spouse name: Not on file  . Number of children: Not on file  . Years of education: Not on file  . Highest education level: Not on file  Occupational History  . Not on file  Social Needs  . Financial resource strain: Not on file  . Food insecurity:    Worry: Not on file    Inability: Not on file  . Transportation needs:    Medical: Not on file    Non-medical: Not on file  Tobacco Use  . Smoking status: Former Research scientist (life sciences)  . Smokeless tobacco: Never Used  Substance and Sexual Activity  . Alcohol use: No    Alcohol/week: 0.0 oz  . Drug use: No  . Sexual activity: Never    Birth control/protection: Post-menopausal, Surgical  Lifestyle  . Physical activity:    Days per week: Not on file    Minutes  per session: Not on file  . Stress: Not on file  Relationships  . Social connections:    Talks on phone: Not on file    Gets together: Not on file    Attends religious service: Not on file    Active member of club or organization: Not on file    Attends meetings of clubs or organizations: Not on file    Relationship status: Not on file  Other Topics Concern  . Not on file  Social History Narrative  . Not on file   Allergies  Allergen Reactions  . Erythromycin Nausea And Vomiting   Family History  Problem Relation Age of Onset  . Sleep apnea Father        resulting right heart failure  . Heart disease Father   . Hypertension Father   . Lymphoma Mother        non hodgkins lymphoma  . Stroke Maternal Grandmother   . Alcohol abuse Maternal Grandfather   . Diabetes Paternal Grandmother      Past medical history, social, surgical and family history all reviewed in electronic medical record.  No pertanent information unless stated regarding to the chief complaint.   Review of Systems:Review of systems updated and as accurate as of  03/19/18  No headache, visual changes, nausea, vomiting, diarrhea, constipation, dizziness, abdominal pain, skin rash, fevers, chills, night sweats, weight loss, swollen lymph nodes, body aches, joint swelling, chest pain, shortness of breath, mood changes.  Positive muscle aches  Objective  Blood pressure 140/70, pulse 65, height 5\' 2"  (1.575 m), weight 200 lb (90.7 kg), SpO2 98 %. Systems examined below as of 03/19/18   General: No apparent distress alert and oriented x3 mood and affect normal, dressed appropriately.  HEENT: Pupils equal, extraocular movements intact  Respiratory: Patient's speak in full sentences and does not appear short of breath  Cardiovascular: No lower extremity edema, non tender, no erythema  Skin: Warm dry intact with no signs of infection or rash on extremities or on axial skeleton.  Abdomen: Soft nontender  Neuro:  Cranial nerves II through XII are intact, neurovascularly intact in all extremities with 2+ DTRs and 2+ pulses.  Lymph: No lymphadenopathy of posterior or anterior cervical chain or axillae bilaterally.  Gait normal with good balance and coordination.  MSK:  Non tender with full range of motion and good stability and symmetric strength and tone of shoulders, elbows, wrist, hip, knee and ankles bilaterally.  Back Exam:  Inspection: Loss of lordosis Motion: Flexion 35 deg, Extension 25 deg, Side Bending to 25 deg bilaterally,  Rotation to 35 deg bilaterally  SLR laying: Negative  XSLR laying: Negative  Palpable tenderness: Tightness noted in the paraspinal musculature lumbar spine right greater than left. FABER: negative. Sensory change: Gross sensation intact to all lumbar and sacral dermatomes.  Reflexes: 2+ at both patellar tendons, 2+ at achilles tendons, Babinski's downgoing.  Strength at foot  Plantar-flexion: 5/5 Dorsi-flexion: 5/5 Eversion: 5/5 Inversion: 5/5  Leg strength  Quad: 5/5 Hamstring: 5/5 Hip flexor: 5/5 Hip abductors: 5/5  Gait unremarkable.   Osteopathic findings C6 flexed rotated and side bent left T3 extended rotated and side bent right inhaled third rib T6 extended rotated and side bent left L3 flexed rotated and side bent right Sacrum right on right    Impression and Recommendations:     This case required medical decision making of moderate complexity.      Note: This dictation was prepared with Dragon dictation along with smaller phrase technology. Any transcriptional errors that result from this process are unintentional.

## 2018-03-19 ENCOUNTER — Encounter: Payer: Self-pay | Admitting: Family Medicine

## 2018-03-19 ENCOUNTER — Ambulatory Visit (INDEPENDENT_AMBULATORY_CARE_PROVIDER_SITE_OTHER): Payer: Medicare HMO | Admitting: Family Medicine

## 2018-03-19 VITALS — BP 140/70 | HR 65 | Ht 62.0 in | Wt 200.0 lb

## 2018-03-19 DIAGNOSIS — M533 Sacrococcygeal disorders, not elsewhere classified: Secondary | ICD-10-CM

## 2018-03-19 DIAGNOSIS — M999 Biomechanical lesion, unspecified: Secondary | ICD-10-CM | POA: Diagnosis not present

## 2018-03-19 NOTE — Patient Instructions (Signed)
Good to see you  Barbara Burns is your friend.  Vitamin C with iron containing meals See me again in 2-3 months

## 2018-03-19 NOTE — Assessment & Plan Note (Signed)
Stable overall.  Discussed icing regimen and home exercise.  Return to clinic again in 2 to 3 months.

## 2018-03-19 NOTE — Assessment & Plan Note (Signed)
Decision today to treat with OMT was based on Physical Exam  After verbal consent patient was treated with HVLA, ME, FPR techniques in cervical, thoracic, rib, lumbar and sacral areas  Patient tolerated the procedure well with improvement in symptoms  Patient given exercises, stretches and lifestyle modifications  See medications in patient instructions if given  Patient will follow up in 8-12 weeks 

## 2018-03-24 ENCOUNTER — Ambulatory Visit: Payer: Medicare HMO | Admitting: Internal Medicine

## 2018-03-24 ENCOUNTER — Encounter: Payer: Self-pay | Admitting: Internal Medicine

## 2018-03-24 VITALS — BP 130/80 | HR 64 | Temp 98.0°F | Resp 18 | Wt 198.0 lb

## 2018-03-24 DIAGNOSIS — G4730 Sleep apnea, unspecified: Secondary | ICD-10-CM | POA: Diagnosis not present

## 2018-03-24 DIAGNOSIS — E611 Iron deficiency: Secondary | ICD-10-CM

## 2018-03-24 DIAGNOSIS — E7800 Pure hypercholesterolemia, unspecified: Secondary | ICD-10-CM | POA: Diagnosis not present

## 2018-03-24 DIAGNOSIS — R7989 Other specified abnormal findings of blood chemistry: Secondary | ICD-10-CM

## 2018-03-24 DIAGNOSIS — R945 Abnormal results of liver function studies: Secondary | ICD-10-CM

## 2018-03-24 DIAGNOSIS — I10 Essential (primary) hypertension: Secondary | ICD-10-CM | POA: Diagnosis not present

## 2018-03-24 DIAGNOSIS — R739 Hyperglycemia, unspecified: Secondary | ICD-10-CM | POA: Diagnosis not present

## 2018-03-24 DIAGNOSIS — E669 Obesity, unspecified: Secondary | ICD-10-CM | POA: Diagnosis not present

## 2018-03-24 DIAGNOSIS — R0602 Shortness of breath: Secondary | ICD-10-CM

## 2018-03-24 NOTE — Patient Instructions (Signed)
Ferrous sulfate (325mg ) - take one tablet 3 days per week.

## 2018-03-24 NOTE — Progress Notes (Signed)
Patient ID: Barbara Burns, female   DOB: December 25, 1945, 72 y.o.   MRN: [id number]   Subjective:    Patient ID: Barbara Burns, female    DOB: 01/17/1946, 72 y.o.   MRN: [id number]  HPI  Patient here for a scheduled follow up.  She reports she is doing relatively well.  Some increased stress.  Feels she is handling things relatively well.  Does report noticing some sob with exertion.  This is a change for her.  Has started getting back to exercising.  No chest pain.  Does report occasional notice of what appears to be a skipped beat.  No increased heart rate or palpitations.  No acid reflux.  No abdominal pain.  Bowels moving. States blood pressures averaging 120/60s.  Has known sleep apnea.  Has been tested previously.  Not using cpap secondary to intolerance.  Has been years since tried.     Past Medical History:  Diagnosis Date  . Chicken pox   . Endometrial cancer [facility])    s/p hysterectomy - 2003  . GERD (gastroesophageal reflux disease)   . Hypercholesterolemia   . Hypertension    Past Surgical History:  Procedure Laterality Date  . ABDOMINAL HYSTERECTOMY     total, may 2003  . APPENDECTOMY     age 47  . COLONOSCOPY WITH PROPOFOL N/A 05/20/2015   Procedure: COLONOSCOPY WITH PROPOFOL;  Surgeon: Manya Silvas, MD;  Location: [facility] ENDOSCOPY;  Service: Endoscopy;  Laterality: N/A;  . OOPHORECTOMY     left, secondary to ovarian cyst   Family History  Problem Relation Age of Onset  . Sleep apnea Father        resulting right heart failure  . Heart disease Father   . Hypertension Father   . Lymphoma Mother        non hodgkins lymphoma  . Stroke Maternal Grandmother   . Alcohol abuse Maternal Grandfather   . Diabetes Paternal Grandmother    Social History   Socioeconomic History  . Marital status: Married    Spouse name: Not on file  . Number of children: Not on file  . Years of education: Not on file  . Highest education level: Not on file  Occupational History  .  Not on file  Social Needs  . Financial resource strain: Not on file  . Food insecurity:    Worry: Not on file    Inability: Not on file  . Transportation needs:    Medical: Not on file    Non-medical: Not on file  Tobacco Use  . Smoking status: Former Research scientist (life sciences)  . Smokeless tobacco: Never Used  Substance and Sexual Activity  . Alcohol use: No    Alcohol/week: 0.0 oz  . Drug use: No  . Sexual activity: Never    Birth control/protection: Post-menopausal, Surgical  Lifestyle  . Physical activity:    Days per week: Not on file    Minutes per session: Not on file  . Stress: Not on file  Relationships  . Social connections:    Talks on phone: Not on file    Gets together: Not on file    Attends religious service: Not on file    Active member of club or organization: Not on file    Attends meetings of clubs or organizations: Not on file    Relationship status: Not on file  Other Topics Concern  . Not on file  Social History Narrative  . Not on file  Outpatient Encounter Medications as of 03/24/2018  Medication Sig  . aspirin 81 MG tablet Take 81 mg by mouth daily.  . Calcium Carbonate (CALCIUM 600 PO) Take 1 tablet by mouth 2 (two) times daily.  . Cholecalciferol (VITAMIN D3) 2000 UNITS TABS Take 2,000 Units by mouth daily.  Marland Kitchen co-enzyme Q-10 50 MG capsule Take 50 mg by mouth daily.  . fexofenadine-pseudoephedrine (ALLEGRA-D 24) 180-240 MG 24 hr tablet Take 1 tablet by mouth daily.  . fish oil-omega-3 fatty acids 1000 MG capsule Take 1 g by mouth daily.  . hydrochlorothiazide (MICROZIDE) 12.5 MG capsule TAKE 1 CAPSULE DAILY  . irbesartan-hydrochlorothiazide (AVALIDE) 150-12.5 MG tablet Take 1 tablet by mouth daily.  Marland Kitchen omeprazole (PRILOSEC) 20 MG capsule Take 20 mg by mouth daily.  . Turmeric 500 MG CAPS Take 1 capsule by mouth daily.   No facility-administered encounter medications on file as of 03/24/2018.     Review of Systems  Constitutional: Negative for appetite change  and unexpected weight change.  HENT: Negative for congestion and sinus pressure.   Respiratory: Negative for cough and chest tightness.        SOB with exetion.    Cardiovascular: Negative for chest pain and leg swelling.       Occasional skipped beat as outlined.    Gastrointestinal: Negative for abdominal pain, diarrhea, nausea and vomiting.  Genitourinary: Negative for difficulty urinating and dysuria.  Musculoskeletal: Negative for joint swelling and myalgias.  Skin: Negative for color change and rash.  Neurological: Negative for dizziness, light-headedness and headaches.  Psychiatric/Behavioral: Negative for agitation and dysphoric mood.       Objective:    Physical Exam  Constitutional: She appears well-developed and well-nourished. No distress.  HENT:  Nose: Nose normal.  Mouth/Throat: Oropharynx is clear and moist.  Neck: Neck supple. No thyromegaly present.  Cardiovascular: Normal rate and regular rhythm.  Pulmonary/Chest: Breath sounds normal. No respiratory distress. She has no wheezes.  Abdominal: Soft. Bowel sounds are normal. There is no tenderness.  Musculoskeletal: She exhibits no edema or tenderness.  Lymphadenopathy:    She has no cervical adenopathy.  Skin: No rash noted. No erythema.  Psychiatric: She has a normal mood and affect. Her behavior is normal.    BP 130/80 (BP Location: Left Arm, Patient Position: Sitting, Cuff Size: Normal)   Pulse 64   Temp 98 F (36.7 C) (Oral)   Resp 18   Wt 198 lb (89.8 kg)   SpO2 98%   BMI 36.21 kg/m  Wt Readings from Last 3 Encounters:  03/24/18 198 lb (89.8 kg)  03/19/18 200 lb (90.7 kg)  12/30/17 200 lb (90.7 kg)     Lab Results  Component Value Date   WBC 7.6 02/19/2018   HGB 14.3 02/19/2018   HCT 42.6 02/19/2018   PLT 317.0 02/19/2018   GLUCOSE 122 (H) 02/19/2018   CHOL 212 (H) 02/19/2018   TRIG 166.0 (H) 02/19/2018   HDL 41.30 02/19/2018   LDLDIRECT 154.1 08/11/2013   LDLCALC 138 (H) 02/19/2018    ALT 10 02/19/2018   AST 10 02/19/2018   NA 140 02/19/2018   K 4.6 02/19/2018   CL 103 02/19/2018   CREATININE 0.82 02/19/2018   BUN 14 02/19/2018   CO2 31 02/19/2018   TSH 3.36 02/28/2017   HGBA1C 6.0 02/19/2018   MICROALBUR <0.7 02/23/2015    Dg Lumbar Spine Complete  Result Date: 10/03/2017 CLINICAL DATA:  Back pain.  No known injury. EXAM: LUMBAR SPINE - COMPLETE  4+ VIEW COMPARISON:  No recent. FINDINGS: Lumbar spine numbered with the lowest segmented appearing lumbar shaped vertebra as L5. Diffuse multilevel degenerative change. Degenerative changes most prominent at L1-L2. No acute bony abnormality. Aortic atherosclerotic vascular calcification. Surgical clips in the pelvis. IMPRESSION: 1. Diffuse multilevel degenerative change. Degenerative changes most prominent at L1-L2. No acute abnormality identified. 2.  Aortic atherosclerotic vascular disease. Electronically Signed   By: Marcello Moores  Register   On: 10/03/2017 06:43       Assessment & Plan:   Problem List Items Addressed This Visit    Abnormal liver function test    Recent liver panel wnl.       Hypercholesterolemia    Low cholesterol diet and exercise.  Discussed recent lab results.  She declines to start cholesterol medication.  Follow lipid panel.   Lab Results  Component Value Date   CHOL 212 (H) 02/19/2018   HDL 41.30 02/19/2018   LDLCALC 138 (H) 02/19/2018   LDLDIRECT 154.1 08/11/2013   TRIG 166.0 (H) 02/19/2018   CHOLHDL 5 02/19/2018        Relevant Orders   Lipid panel   Hepatic function panel   Hyperglycemia    Low carb diet and exercise.  Follow met b and a1c.        Relevant Orders   Hemoglobin A1c   Hypertension    Blood pressure under good control.  Continue same medication regimen.  Follow pressures.  Follow metabolic panel.        Relevant Orders   TSH   Basic metabolic panel   Iron deficiency    Hgb wnl.  Ferritin low norma.  Start ferrous sulfate as directed.  Follow.        Relevant  Orders   CBC with Differential/Platelet   Ferritin   Obesity (BMI 30-39.9)    Discussed diet and exercise.  Follow.       Sleep apnea    She is not using cpap and hasn't for a while.  Discussed her current symptoms, etc.  Discussed importance of treating.  Discussed risk of untreated sleep apnea.  Refer to pulmonary for reevaluation.        Relevant Orders   Ambulatory referral to Pulmonology   SOB (shortness of breath) on exertion - Primary    Has noticed sob with exertion.  New for her.  EKG - SR with no acute ischemic changes.  Given risk factors and change in symptoms, do feel she warrants further cardiac evaluation.  Refer to cardiology.        Relevant Orders   EKG 12-Lead (Completed)   Ambulatory referral to Cardiology       Einar Pheasant, MD

## 2018-03-25 ENCOUNTER — Encounter: Payer: Self-pay | Admitting: Internal Medicine

## 2018-03-25 DIAGNOSIS — R0602 Shortness of breath: Secondary | ICD-10-CM | POA: Insufficient documentation

## 2018-03-25 DIAGNOSIS — E611 Iron deficiency: Secondary | ICD-10-CM | POA: Insufficient documentation

## 2018-03-25 NOTE — Assessment & Plan Note (Signed)
Has noticed sob with exertion.  New for her.  EKG - SR with no acute ischemic changes.  Given risk factors and change in symptoms, do feel she warrants further cardiac evaluation.  Refer to cardiology.

## 2018-03-25 NOTE — Assessment & Plan Note (Signed)
She is not using cpap and hasn't for a while.  Discussed her current symptoms, etc.  Discussed importance of treating.  Discussed risk of untreated sleep apnea.  Refer to pulmonary for reevaluation.

## 2018-03-25 NOTE — Assessment & Plan Note (Signed)
Recent liver panel wnl.  

## 2018-03-25 NOTE — Assessment & Plan Note (Signed)
Low cholesterol diet and exercise.  Discussed recent lab results.  She declines to start cholesterol medication.  Follow lipid panel.   Lab Results  Component Value Date   CHOL 212 (H) 02/19/2018   HDL 41.30 02/19/2018   LDLCALC 138 (H) 02/19/2018   LDLDIRECT 154.1 08/11/2013   TRIG 166.0 (H) 02/19/2018   CHOLHDL 5 02/19/2018

## 2018-03-25 NOTE — Assessment & Plan Note (Signed)
Low carb diet and exercise.  Follow met b and a1c.   

## 2018-03-25 NOTE — Assessment & Plan Note (Signed)
Blood pressure under good control.  Continue same medication regimen.  Follow pressures.  Follow metabolic panel.   

## 2018-03-25 NOTE — Assessment & Plan Note (Signed)
Discussed diet and exercise.  Follow.  

## 2018-03-25 NOTE — Assessment & Plan Note (Signed)
Hgb wnl.  Ferritin low norma.  Start ferrous sulfate as directed.  Follow.

## 2018-03-30 NOTE — Progress Notes (Signed)
Cardiology Office Note  Date:  04/02/2018   ID:  Barbara Burns, DOB 1946/05/19, MRN [id number]  PCP:  Einar Pheasant, MD   Chief Complaint  Patient presents with  . other    Ref by Einar Pheasant, MD for PVC's and shortness of breath. Meds reviewed by the pt. verbally.     HPI:  Ms Barbara [place] is a 72 yo woman with PMH of  Hyperlipidemia HTN Former smoker, quit 1974 Referred by Dr. Nicki Reaper for sx of SOB  Former nurse  increased stress recently  some sob with exertion, thinks that his conditioning  occasional notice of what appears to be a skipped beat.  sleep apnea.  Has been tested previously.   Not using cpap secondary to intolerance.    years since tried.   Goes to the [facility] 2x per week More SOB with exercise recently  Lab work reviewed with her in detail HBA1C 6.0 Total 212 Got stiff on prasvatatin Does not want a statin  CT scan abdomen and pelvis reviewed with her detail Images pulled up in the office  showing moderate distal descending aorta atherosclerosis Mild in the iliac arteries  EKG personally reviewed by myself on todays visit Shows normal sinus rhythm with rate 64 bpm no significant ST or T-wave changes  PMH:   has a past medical history of Chicken pox, Endometrial cancer ([place]), GERD (gastroesophageal reflux disease), Hypercholesterolemia, and Hypertension.  PSH:    Past Surgical History:  Procedure Laterality Date  . ABDOMINAL HYSTERECTOMY     total, may 2003  . APPENDECTOMY     age 33  . COLONOSCOPY WITH PROPOFOL N/A 05/20/2015   Procedure: COLONOSCOPY WITH PROPOFOL;  Surgeon: Manya Silvas, MD;  Location: [facility] ENDOSCOPY;  Service: Endoscopy;  Laterality: N/A;  . OOPHORECTOMY     left, secondary to ovarian cyst    Current Outpatient Medications  Medication Sig Dispense Refill  . aspirin 81 MG tablet Take 81 mg by mouth daily.    . Calcium Carbonate (CALCIUM 600 PO) Take 1 tablet by mouth 2 (two) times daily.    . Cholecalciferol  (VITAMIN D3) 2000 UNITS TABS Take 2,000 Units by mouth daily.    Marland Kitchen co-enzyme Q-10 50 MG capsule Take 50 mg by mouth daily.    . Ferrous Sulfate (IRON) 325 (65 Fe) MG TABS Take 1 tablet by mouth daily.    . fexofenadine-pseudoephedrine (ALLEGRA-D 24) 180-240 MG 24 hr tablet Take 1 tablet by mouth daily.    . fish oil-omega-3 fatty acids 1000 MG capsule Take 1 g by mouth daily.    . hydrochlorothiazide (MICROZIDE) 12.5 MG capsule TAKE 1 CAPSULE DAILY 90 capsule 1  . irbesartan-hydrochlorothiazide (AVALIDE) 150-12.5 MG tablet Take 1 tablet by mouth daily. 90 tablet 2  . omeprazole (PRILOSEC) 20 MG capsule Take 20 mg by mouth daily.    . Turmeric 500 MG CAPS Take 1 capsule by mouth daily.    Marland Kitchen ezetimibe (ZETIA) 10 MG tablet Take 1 tablet (10 mg total) by mouth daily. 30 tablet 11   No current facility-administered medications for this visit.      Allergies:   Erythromycin   Social History:  The patient  reports that she quit smoking about 40 years ago. Her smoking use included cigarettes. She has a 40.00 pack-year smoking history. She has never used smokeless tobacco. She reports that she does not drink alcohol or use drugs.   Family History:   family history includes Alcohol abuse in her  maternal grandfather; Diabetes in her paternal grandmother; Heart disease in her father; Hypertension in her father; Lymphoma in her mother; Sleep apnea in her father; Stroke in her maternal grandmother.    Review of Systems: Review of Systems  Constitutional: Negative.   Respiratory: Positive for shortness of breath.   Cardiovascular: Negative.   Gastrointestinal: Negative.   Musculoskeletal: Negative.   Neurological: Negative.   Psychiatric/Behavioral: Negative.   All other systems reviewed and are negative.    PHYSICAL EXAM: VS:  BP 130/80 (BP Location: Left Arm, Patient Position: Sitting, Cuff Size: Large)   Pulse 64   Ht 5\' 3"  (1.6 m)   Wt 198 lb 12 oz (90.2 kg)   BMI 35.21 kg/m  , BMI Body  mass index is 35.21 kg/m. GEN: Well nourished, well developed, in no acute distress  HEENT: normal  Neck: no JVD, carotid bruits, or masses Cardiac: RRR; no murmurs, rubs, or gallops,no edema  Respiratory:  clear to auscultation bilaterally, normal work of breathing GI: soft, nontender, nondistended, + BS MS: no deformity or atrophy  Skin: warm and dry, no rash Neuro:  Strength and sensation are intact Psych: euthymic mood, full affect    Recent Labs: 02/19/2018: ALT 10; BUN 14; Creatinine, Ser 0.82; Hemoglobin 14.3; Platelets 317.0; Potassium 4.6; Sodium 140    Lipid Panel Lab Results  Component Value Date   CHOL 212 (H) 02/19/2018   HDL 41.30 02/19/2018   LDLCALC 138 (H) 02/19/2018   TRIG 166.0 (H) 02/19/2018      Wt Readings from Last 3 Encounters:  04/02/18 198 lb 12 oz (90.2 kg)  03/31/18 197 lb 12.8 oz (89.7 kg)  03/24/18 198 lb (89.8 kg)       ASSESSMENT AND PLAN:  SOB (shortness of breath) on exertion - Plan: EKG 12-Lead Suspect secondary to prior smoking history, obesity, deconditioning Unable to exclude underlying ischemia She does have history of hyperlipidemia and prior smoking  Hypercholesterolemia - Plan: EKG 12-Lead Long discussion with her concerning cholesterol management She does not want a statin We will start Zetia 10 mg daily Goal LDL less than 70 given her aortic atherosclerosis Discussed other treatment options coming out of the next year or so  Essential hypertension Blood pressure is well controlled on today's visit. No changes made to the medications.  Sleep apnea, unspecified type Previously diagnosed but does not want CPAP  Shortness of breath - Plan: CT CARDIAC SCORING suspect secondary to weight, deconditioning, prior smoking Calcium score as below for risk stratification for underlying ischemia  Aortic atherosclerosis (HCC) - Plan: CT CARDIAC SCORING Given her aortic atherosclerosis we have ordered CT coronary calcium scoring  for risk stratification given her shortness of breath For low calcium score would likely not need stress testing For high calcium score would consider stress testing for further evaluation Recommended regular walking program  Disposition:   F/U  As needed   Total encounter time more than 60 minutes  Greater than 50% was spent in counseling and coordination of care with the patient    Orders Placed This Encounter  Procedures  . CT CARDIAC SCORING  . EKG 12-Lead     Signed, Esmond Plants, M.D., Ph.D. 04/02/2018  [place], [place]

## 2018-03-31 ENCOUNTER — Ambulatory Visit: Payer: Medicare HMO | Admitting: Internal Medicine

## 2018-03-31 ENCOUNTER — Encounter: Payer: Self-pay | Admitting: Internal Medicine

## 2018-03-31 VITALS — BP 126/74 | HR 69 | Ht 63.0 in | Wt 197.8 lb

## 2018-03-31 DIAGNOSIS — G4719 Other hypersomnia: Secondary | ICD-10-CM

## 2018-03-31 DIAGNOSIS — R05 Cough: Secondary | ICD-10-CM | POA: Diagnosis not present

## 2018-03-31 DIAGNOSIS — R059 Cough, unspecified: Secondary | ICD-10-CM

## 2018-03-31 NOTE — Patient Instructions (Addendum)
Will send for HST if covered, if not may require a in-lab sleep study.   Try OTC delsym-DM ( or mucinex DM) for your cough and sore throat.    Sleep Apnea    Sleep apnea is disorder that affects a person's sleep. A person with sleep apnea has abnormal pauses in their breathing when they sleep. It is hard for them to get a good sleep. This makes a person tired during the day. It also can lead to other physical problems. There are three types of sleep apnea. One type is when breathing stops for a short time because your airway is blocked (obstructive sleep apnea). Another type is when the brain sometimes fails to give the normal signal to breathe to the muscles that control your breathing (central sleep apnea). The third type is a combination of the other two types.  HOME CARE   Take all medicine as told by your doctor.  Avoid alcohol, calming medicines (sedatives), and depressant drugs.  Try to lose weight if you are overweight. Talk to your doctor about a healthy weight goal.  Your doctor may have you use a device that helps to open your airway. It can help you get the air that you need. It is called a positive airway pressure (PAP) device.   MAKE SURE YOU:   Understand these instructions.  Will watch your condition.  Will get help right away if you are not doing well or get worse.  It may take approximately 1 month for you to get used to wearing her CPAP every night.  Be sure to work with your machine to get used to it, be patient, it may take time!  If you have trouble tolerating CPAP DO NOT RETURN YOUR MACHINE; Contact our office to see if we can help you tolerate the CPAP better first!

## 2018-03-31 NOTE — Progress Notes (Signed)
Barbara Burns      Assessment and Plan:  Excessive daytime sleepiness, with snoring.  History of obstructive sleep apnea. - Patient has history of obstructive sleep apnea, did not like CPAP at that time, is hesitant to restart, we discussed the impact of obstructive sleep apnea and the adverse effects it can have on health.  Patient feels that her sleep apnea is fairly well-controlled when she sleeps on her side. - She is open to checking a sleep study, she can sleep on her side for that test which can be either a HST or in lab sleep study as covered by insurance. -If sleep apnea is still present on the study would recommend starting on CPAP. 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  Cough with sore throat. - Appears to have come on after recent secondhand smoke exposures. - Asked to use over-the-counter medicine such as Delsym/Mucinex. - Remote history of smoking, last smoked in 1979. - If still present at next visit will need to reassess with imaging.   Date: 03/31/2018  MRN# [id number] Barbara Burns January 01, 1946   Barbara Burns is a 72 y.o. old female seen in [place] for chief complaint of:    Chief Complaint  Patient presents with  . Consult    Dr. Einar Pheasant, Pt dx's with sleep apnea. Had sleep study years ago and tried CPAP but failed to wear it-unable to tolerate.     HPI:   The patient is a 72 year old female, she usually goes to bed between 11 PM and midnight.  Falls asleep quickly within a few minutes.  She usually gets out of bed at 8 AM.  She was diagnosed with sleep apnea about 5 to 6 years ago.  She used CPAP for about a year or less, but found it irritating, she did not like the noise, the hassle, the mask. She has 2 cats in bed, her cats sometimes keep her awake. She did not notice much of a change in daytime sleepiness.  She is sleepy during the day, she  will tend to doze for 10-15 min during the day. She sits all day, and is sitting all day, she does not fall asleep at those times.  No close calls while driving.  She has HTN which is well controlled.  She has noticed that she has been going to exercise class three times per week, she has been more tired lately, she has been more tired over the past few months.  She also notes that recently she went to a gathering where there was a lot of smokers, and she has had a bit of a sore throat with cough since that time. It was also noted that her iron level is low, and was recently started on it.  She has been told that she snores loudly. She tend to sleep on her side and stomach and does ok, but notes that if she sleeps on her back her breathing will stop.   She is currently wearing a night guard due to grinding of teeth.    PMHX:   Past Medical History:  Diagnosis Date  . Chicken pox   . Endometrial cancer [facility])    s/p hysterectomy - 2003  . GERD (gastroesophageal reflux disease)   . Hypercholesterolemia   . Hypertension    Surgical Hx:  Past Surgical History:  Procedure Laterality Date  . ABDOMINAL HYSTERECTOMY  total, may 2003  . APPENDECTOMY     age 58  . COLONOSCOPY WITH PROPOFOL N/A 05/20/2015   Procedure: COLONOSCOPY WITH PROPOFOL;  Surgeon: Manya Silvas, MD;  Location: [facility] ENDOSCOPY;  Service: Endoscopy;  Laterality: N/A;  . OOPHORECTOMY     left, secondary to ovarian cyst   Family Hx:  Family History  Problem Relation Age of Onset  . Sleep apnea Father        resulting right heart failure  . Heart disease Father   . Hypertension Father   . Lymphoma Mother        non hodgkins lymphoma  . Stroke Maternal Grandmother   . Alcohol abuse Maternal Grandfather   . Diabetes Paternal Grandmother    Social Hx:   Social History   Tobacco Use  . Smoking status: Former Smoker    Packs/day: 1.00    Years: 40.00    Pack years: 40.00    Types: Cigarettes    Last attempt  to quit: 03/31/1978    Years since quitting: 40.0  . Smokeless tobacco: Never Used  Substance Use Topics  . Alcohol use: No    Alcohol/week: 0.0 oz  . Drug use: No   Medication:    Current Outpatient Medications:  .  aspirin 81 MG tablet, Take 81 mg by mouth daily., Disp: , Rfl:  .  Calcium Carbonate (CALCIUM 600 PO), Take 1 tablet by mouth 2 (two) times daily., Disp: , Rfl:  .  Cholecalciferol (VITAMIN D3) 2000 UNITS TABS, Take 2,000 Units by mouth daily., Disp: , Rfl:  .  co-enzyme Q-10 50 MG capsule, Take 50 mg by mouth daily., Disp: , Rfl:  .  Ferrous Sulfate (IRON) 325 (65 Fe) MG TABS, Take 1 tablet by mouth daily., Disp: , Rfl:  .  fexofenadine-pseudoephedrine (ALLEGRA-D 24) 180-240 MG 24 hr tablet, Take 1 tablet by mouth daily., Disp: , Rfl:  .  fish oil-omega-3 fatty acids 1000 MG capsule, Take 1 g by mouth daily., Disp: , Rfl:  .  hydrochlorothiazide (MICROZIDE) 12.5 MG capsule, TAKE 1 CAPSULE DAILY, Disp: 90 capsule, Rfl: 1 .  irbesartan-hydrochlorothiazide (AVALIDE) 150-12.5 MG tablet, Take 1 tablet by mouth daily., Disp: 90 tablet, Rfl: 2 .  omeprazole (PRILOSEC) 20 MG capsule, Take 20 mg by mouth daily., Disp: , Rfl:  .  Turmeric 500 MG CAPS, Take 1 capsule by mouth daily., Disp: , Rfl:    Allergies:  Erythromycin  Review of Systems: Gen:  Denies  fever, sweats, chills HEENT: Denies blurred vision, double vision. bleeds, sore throat Cvc:  No dizziness, chest pain. Resp:   Denies cough or sputum production, shortness of breath Gi: Denies swallowing difficulty, stomach pain. Gu:  Denies bladder incontinence, burning urine Ext:   No Joint pain, stiffness. Skin: No skin rash,  hives  Endoc:  No polyuria, polydipsia. Psych: No depression, insomnia. Other:  All other systems were reviewed with the patient and were negative other that what is mentioned in the HPI.   Physical Examination:   VS: BP 126/74 (BP Location: Left Arm, Cuff Size: Normal)   Pulse 69   Ht 5\' 3"   (1.6 m)   Wt 197 lb 12.8 oz (89.7 kg)   SpO2 98%   BMI 35.04 kg/m   General Appearance: No distress  Neuro:without focal findings,  speech normal,  HEENT: PERRLA, EOM intact.   Pulmonary: normal breath sounds, No wheezing.  CardiovascularNormal S1,S2.  No m/r/g.   Abdomen: Benign, Soft, non-tender. Renal:  No  costovertebral tenderness  GU:  No performed at this time. Endoc: No evident thyromegaly, no signs of acromegaly. Skin:   warm, no rashes, no ecchymosis  Extremities: normal, no cyanosis, clubbing.  Other findings:    LABORATORY PANEL:   CBC No results for input(s): WBC, HGB, HCT, PLT in the last 168 hours. ------------------------------------------------------------------------------------------------------------------  Chemistries  No results for input(s): NA, K, CL, CO2, GLUCOSE, BUN, CREATININE, CALCIUM, MG, AST, ALT, ALKPHOS, BILITOT in the last 168 hours.  Invalid input(s): GFRCGP ------------------------------------------------------------------------------------------------------------------  Cardiac Enzymes No results for input(s): TROPONINI in the last 168 hours. ------------------------------------------------------------  RADIOLOGY:  No results found.     Thank  you for the [place] and for allowing [facility] to assist in the care of your patient. Our recommendations are noted above.  Please contact us if we can be of further service.   Marda Stalker, M.D., F.C.C.P.  Board Certified in Internal Medicine, Pulmonary Medicine, [place], and Sleep Medicine.  [facility] Office Number: [phone]   03/31/2018

## 2018-04-02 ENCOUNTER — Ambulatory Visit: Payer: Medicare HMO | Admitting: Cardiovascular Disease

## 2018-04-02 ENCOUNTER — Encounter: Payer: Self-pay | Admitting: Cardiovascular Disease

## 2018-04-02 VITALS — BP 130/80 | HR 64 | Ht 63.0 in | Wt 198.8 lb

## 2018-04-02 DIAGNOSIS — I700 Atherosclerosis of aorta: Secondary | ICD-10-CM | POA: Diagnosis not present

## 2018-04-02 DIAGNOSIS — I10 Essential (primary) hypertension: Secondary | ICD-10-CM | POA: Diagnosis not present

## 2018-04-02 DIAGNOSIS — R0602 Shortness of breath: Secondary | ICD-10-CM | POA: Diagnosis not present

## 2018-04-02 DIAGNOSIS — E7800 Pure hypercholesterolemia, unspecified: Secondary | ICD-10-CM | POA: Diagnosis not present

## 2018-04-02 DIAGNOSIS — G4730 Sleep apnea, unspecified: Secondary | ICD-10-CM | POA: Diagnosis not present

## 2018-04-02 MED ORDER — EZETIMIBE 10 MG PO TABS: 10.0000 mg | ORAL_TABLET | Freq: Every day | ORAL | 11 refills | Status: DC

## 2018-04-02 NOTE — Patient Instructions (Addendum)
Medication Instructions:   Start zetia one a day for cholesterol  Labwork:  No new labs needed  Testing/Procedures:  Call if you ever would like a CT coronary calcium score For SOB, aortic athero  Please call [phone] to schedule [address] [place], [place] [address]  $150 out of pocket  Follow-Up: It was a pleasure seeing you in the office today. Please call us if you have new issues that need to be addressed before your next appt.  [phone]  Your physician wants you to follow-up in: 12 months.  You will receive a reminder letter in the mail two months in advance. If you don't receive a letter, please call our office to schedule the follow-up appointment.  If you need a refill on your cardiac medications before your next appointment, please call your pharmacy.  For educational health videos Log in to : [email or web address] Or : [email or web address], password : [email or web address]

## 2018-04-18 ENCOUNTER — Ambulatory Visit (INDEPENDENT_AMBULATORY_CARE_PROVIDER_SITE_OTHER)
Admission: RE | Admit: 2018-04-18 | Discharge: 2018-04-18 | Disposition: A | Discharge status: Home / Self Care | Payer: Self-pay | Source: Ambulatory Visit | Attending: Cardiovascular Disease | Admitting: Cardiovascular Disease

## 2018-04-18 DIAGNOSIS — I700 Atherosclerosis of aorta: Secondary | ICD-10-CM

## 2018-04-18 DIAGNOSIS — R0602 Shortness of breath: Secondary | ICD-10-CM

## 2018-04-23 ENCOUNTER — Telehealth: Payer: Self-pay | Admitting: Cardiovascular Disease

## 2018-04-23 MED ORDER — EZETIMIBE 10 MG PO TABS: 10.0000 mg | ORAL_TABLET | Freq: Every day | ORAL | 3 refills | Status: AC

## 2018-04-23 NOTE — Telephone Encounter (Signed)
Patient calling to discuss recent ct calcium score testing results   Please call

## 2018-04-23 NOTE — Telephone Encounter (Signed)
°*  STAT* If patient is at the pharmacy, call can be transferred to refill team.   1. Which medications need to be refilled? (please list name of each medication and dose if known)  zetia 10 mg po q day   2. Which pharmacy/location (including street and city if local pharmacy) is medication to be sent to?  [place]   3. Do they need a 30 day or 90 day supply? [place]

## 2018-04-24 NOTE — Telephone Encounter (Signed)
Left voicemail message to call back  

## 2018-04-25 NOTE — Telephone Encounter (Signed)
Left voicemail message to call back  

## 2018-04-25 NOTE — Telephone Encounter (Signed)
Patient returning call.

## 2018-04-25 NOTE — Telephone Encounter (Signed)
Spoke with patient and reviewed results with her in detail and she verbalized understanding with no further questions. She will call us if shortness of breath continues to be a problem and we can order echo per Dr. Donivan Scull results note. She was appreciative for the call with no further questions at this time.

## 2018-04-25 NOTE — Telephone Encounter (Signed)
Pt calling calling stating she was only confused when the Radiologist told her she was in the 41% for her test. She states she understands Dr Donivan Scull interpretation just not the Radiologist   Please advise

## 2018-04-30 ENCOUNTER — Telehealth: Payer: Self-pay | Admitting: Internal Medicine

## 2018-04-30 NOTE — Telephone Encounter (Signed)
Barbara Burns had called her insurance checking to see if it was a covered benefit and if Prior Auth was required. I told her what Barbara Burns had found out but she wasn't happy with that information. I called the insurance company and spoke with Barbara Burns. And she stated that any Ref # with AVA was an automated Ref # and to have the call documented you needed to speak with someone. I did get a new Ref # [id number] and it will show that I called asking about Prior Auth or Pre-cert. I have called Barbara Burns back about this and she was more at ease about doing the HST now know that the insurance is aware

## 2018-04-30 NOTE — Telephone Encounter (Signed)
Patient calling when we get Auth for her to do her sleep study next week. Can we please call her, she would like to just make sure we receive it.

## 2018-05-10 ENCOUNTER — Encounter: Payer: Self-pay | Admitting: Internal Medicine

## 2018-05-10 DIAGNOSIS — G4719 Other hypersomnia: Secondary | ICD-10-CM

## 2018-05-19 ENCOUNTER — Telehealth: Payer: Self-pay | Admitting: *Deleted

## 2018-05-19 DIAGNOSIS — G4733 Obstructive sleep apnea (adult) (pediatric): Secondary | ICD-10-CM

## 2018-05-19 NOTE — Telephone Encounter (Signed)
LMTCB

## 2018-05-19 NOTE — Telephone Encounter (Signed)
Pt is returning your call regarding sleep results.

## 2018-05-19 NOTE — Telephone Encounter (Signed)
Pt would like to speak with her dentist and call back before orders are placed. She wears a nightguard and not sure how to proceed.

## 2018-05-28 ENCOUNTER — Telehealth: Payer: Self-pay | Admitting: Internal Medicine

## 2018-05-28 DIAGNOSIS — G4733 Obstructive sleep apnea (adult) (pediatric): Secondary | ICD-10-CM

## 2018-05-28 NOTE — Telephone Encounter (Signed)
Patient calling stating she did sleep study and would like to talk to nurse about further about this  She states she does have sleep apnea    Please advise

## 2018-05-28 NOTE — Telephone Encounter (Signed)
Pt has decided to get a CPAP now. Will need to get HST results before order is placed.

## 2018-05-28 NOTE — Telephone Encounter (Signed)
Order for cpap have been entered. Nothing further needed.

## 2018-06-03 NOTE — Progress Notes (Signed)
[facility] [address] [place], [place] [address] Phone: [phone] Subjective:    I Barbara Burns am serving as a Education administrator for Dr. Hulan Saas.    CC: Back pain follow-up  WSF:[id number]  Barbara Burns is a 72 y.o. female coming in with complaint of back pain. States that she's been having trouble with her left knee.   Leg pain seems to be more stiffness.  Has had pain over the SI joint again.  Seems to be on the left side.  No radicular symptoms.  Patient still able to do all daily activities.  No exacerbation like she has had previously.     Past Medical History:  Diagnosis Date  . Chicken pox   . Endometrial cancer [facility])    s/p hysterectomy - 2003  . GERD (gastroesophageal reflux disease)   . Hypercholesterolemia   . Hypertension    Past Surgical History:  Procedure Laterality Date  . ABDOMINAL HYSTERECTOMY     total, may 2003  . APPENDECTOMY     age 24  . COLONOSCOPY WITH PROPOFOL N/A 05/20/2015   Procedure: COLONOSCOPY WITH PROPOFOL;  Surgeon: Manya Silvas, MD;  Location: [facility] ENDOSCOPY;  Service: Endoscopy;  Laterality: N/A;  . OOPHORECTOMY     left, secondary to ovarian cyst   Social History   Socioeconomic History  . Marital status: Married    Spouse name: Not on file  . Number of children: Not on file  . Years of education: Not on file  . Highest education level: Not on file  Occupational History  . Not on file  Social Needs  . Financial resource strain: Not on file  . Food insecurity:    Worry: Not on file    Inability: Not on file  . Transportation needs:    Medical: Not on file    Non-medical: Not on file  Tobacco Use  . Smoking status: Former Smoker    Packs/day: 1.00    Years: 40.00    Pack years: 40.00    Types: Cigarettes    Last attempt to quit: 03/31/1978    Years since quitting: 40.2  . Smokeless tobacco: Never Used  Substance and Sexual Activity  . Alcohol use: No    Alcohol/week: 0.0  standard drinks  . Drug use: No  . Sexual activity: Never    Birth control/protection: Post-menopausal, Surgical  Lifestyle  . Physical activity:    Days per week: Not on file    Minutes per session: Not on file  . Stress: Not on file  Relationships  . Social connections:    Talks on phone: Not on file    Gets together: Not on file    Attends religious service: Not on file    Active member of club or organization: Not on file    Attends meetings of clubs or organizations: Not on file    Relationship status: Not on file  Other Topics Concern  . Not on file  Social History Narrative  . Not on file   Allergies  Allergen Reactions  . Erythromycin Nausea And Vomiting   Family History  Problem Relation Age of Onset  . Sleep apnea Father        resulting right heart failure  . Heart disease Father   . Hypertension Father   . Lymphoma Mother        non hodgkins lymphoma  . Stroke Maternal Grandmother   . Alcohol abuse Maternal Grandfather   .  Diabetes Paternal Grandmother      Current Outpatient Medications (Cardiovascular):  .  ezetimibe (ZETIA) 10 MG tablet, Take 1 tablet (10 mg total) by mouth daily. .  hydrochlorothiazide (MICROZIDE) 12.5 MG capsule, TAKE 1 CAPSULE DAILY .  irbesartan-hydrochlorothiazide (AVALIDE) 150-12.5 MG tablet, Take 1 tablet by mouth daily.  Current Outpatient Medications (Respiratory):  .  fexofenadine-pseudoephedrine (ALLEGRA-D 24) 180-240 MG 24 hr tablet, Take 1 tablet by mouth daily.  Current Outpatient Medications (Analgesics):  .  aspirin 81 MG tablet, Take 81 mg by mouth daily.  Current Outpatient Medications (Hematological):  Marland Kitchen  Ferrous Sulfate (IRON) 325 (65 Fe) MG TABS, Take 1 tablet by mouth daily.  Current Outpatient Medications (Other):  Marland Kitchen  Calcium Carbonate (CALCIUM 600 PO), Take 1 tablet by mouth 2 (two) times daily. .  Cholecalciferol (VITAMIN D3) 2000 UNITS TABS, Take 2,000 Units by mouth daily. Marland Kitchen  co-enzyme Q-10 50 MG  capsule, Take 50 mg by mouth daily. .  fish oil-omega-3 fatty acids 1000 MG capsule, Take 1 g by mouth daily. Marland Kitchen  omeprazole (PRILOSEC) 20 MG capsule, Take 20 mg by mouth daily. .  Turmeric 500 MG CAPS, Take 1 capsule by mouth daily.    Past medical history, social, surgical and family history all reviewed in electronic medical record.  No pertanent information unless stated regarding to the chief complaint.   Review of Systems:  No headache, visual changes, nausea, vomiting, diarrhea, constipation, dizziness, abdominal pain, skin rash, fevers, chills, night sweats, weight loss, swollen lymph nodes, body aches, joint swelling, muscle aches, chest pain, shortness of breath, mood changes.   Objective  Blood pressure 130/86, pulse 63, height 5\' 3"  (1.6 m), weight 198 lb (89.8 kg), SpO2 97 %.    General: No apparent distress alert and oriented x3 mood and affect normal, dressed appropriately.  HEENT: Pupils equal, extraocular movements intact  Respiratory: Patient's speak in full sentences and does not appear short of breath  Cardiovascular: No lower extremity edema, non tender, no erythema  Skin: Warm dry intact with no signs of infection or rash on extremities or on axial skeleton.  Abdomen: Soft nontender  Neuro: Cranial nerves II through XII are intact, neurovascularly intact in all extremities with 2+ DTRs and 2+ pulses.  Lymph: No lymphadenopathy of posterior or anterior cervical chain or axillae bilaterally.  Gait normal with good balance and coordination.  MSK:  Non tender with full range of motion and good stability and symmetric strength and tone of shoulders, elbows, wrist, hip, knee and ankles bilaterally.   Back Exam:  Inspection: Unremarkable  Motion: Flexion 45 deg, Extension 25 deg, Side Bending to 35 deg bilaterally,  Rotation to 40 deg bilaterally  SLR laying: Negative  XSLR laying: Negative  Palpable tenderness: Tender to palpation the paraspinal musculature of the  thoracolumbar and lumbosacral areas.Marland Kitchen FABER: negative. Sensory change: Gross sensation intact to all lumbar and sacral dermatomes.  Reflexes: 2+ at both patellar tendons, 2+ at achilles tendons, Babinski's downgoing.  Strength at foot  Plantar-flexion: 5/5 Dorsi-flexion: 5/5 Eversion: 5/5 Inversion: 5/5  Leg strength  Quad: 5/5 Hamstring: 5/5 Hip flexor: 5/5 Hip abductors: 4/5  Gait unremarkable.    Osteopathic findings C3 flexed rotated and side bent right T5 extended rotated and side bent left L2 flexed rotated and side bent right Sacrum right on right  Impression and Recommendations:     This case required medical decision making of moderate complexity. The above documentation has been reviewed and is accurate and complete Alroy Dust  Koren Bound, DO       Note: This dictation was prepared with Dragon dictation along with smaller phrase technology. Any transcriptional errors that result from this process are unintentional.

## 2018-06-04 ENCOUNTER — Ambulatory Visit: Payer: Medicare HMO | Admitting: Family Medicine

## 2018-06-04 ENCOUNTER — Encounter: Payer: Self-pay | Admitting: Family Medicine

## 2018-06-04 VITALS — BP 130/86 | HR 63 | Ht 63.0 in | Wt 198.0 lb

## 2018-06-04 DIAGNOSIS — M533 Sacrococcygeal disorders, not elsewhere classified: Secondary | ICD-10-CM | POA: Diagnosis not present

## 2018-06-04 DIAGNOSIS — M999 Biomechanical lesion, unspecified: Secondary | ICD-10-CM

## 2018-06-04 NOTE — Assessment & Plan Note (Signed)
Stable overall.  Discussed icing regimen and home exercises.  Core strengthening.  No high impact.  Follow-up again in 4 to 8 weeks

## 2018-06-04 NOTE — Patient Instructions (Signed)
Great to see you  For the knee likely hamstring, shorten your stride length and ice after activity  Exercises on wall.  Heel and butt touching.  Raise leg 6 inches and hold 2 seconds.  Down slow for count of 4 seconds.  1 set of 30 reps daily on both sides.  See me again in 3 months!

## 2018-06-04 NOTE — Assessment & Plan Note (Signed)
Decision today to treat with OMT was based on Physical Exam  After verbal consent patient was treated with HVLA, ME, FPR techniques in cervical, thoracic, lumbar and sacral areas  Patient tolerated the procedure well with improvement in symptoms  Patient given exercises, stretches and lifestyle modifications  See medications in patient instructions if given  Patient will follow up in 4-8 weeks 

## 2018-06-07 ENCOUNTER — Other Ambulatory Visit: Payer: Self-pay | Admitting: Internal Medicine

## 2018-06-16 DIAGNOSIS — G4733 Obstructive sleep apnea (adult) (pediatric): Secondary | ICD-10-CM | POA: Diagnosis not present

## 2018-06-16 DIAGNOSIS — I10 Essential (primary) hypertension: Secondary | ICD-10-CM | POA: Diagnosis not present

## 2018-07-07 ENCOUNTER — Telehealth: Payer: Self-pay

## 2018-07-07 NOTE — Telephone Encounter (Signed)
Spoke to patient. We need to r/s 07/09/18 since it has not been 31 days of CPAP therapy. Apt needs to be after 07/17/18. Patient aware.

## 2018-07-09 ENCOUNTER — Ambulatory Visit: Payer: Self-pay | Admitting: Internal Medicine

## 2018-07-16 DIAGNOSIS — G4733 Obstructive sleep apnea (adult) (pediatric): Secondary | ICD-10-CM | POA: Diagnosis not present

## 2018-07-16 DIAGNOSIS — I10 Essential (primary) hypertension: Secondary | ICD-10-CM | POA: Diagnosis not present

## 2018-07-21 ENCOUNTER — Ambulatory Visit: Payer: Self-pay | Admitting: Internal Medicine

## 2018-07-28 ENCOUNTER — Other Ambulatory Visit (INDEPENDENT_AMBULATORY_CARE_PROVIDER_SITE_OTHER): Payer: Medicare HMO

## 2018-07-28 DIAGNOSIS — E7800 Pure hypercholesterolemia, unspecified: Secondary | ICD-10-CM | POA: Diagnosis not present

## 2018-07-28 DIAGNOSIS — I10 Essential (primary) hypertension: Secondary | ICD-10-CM

## 2018-07-28 DIAGNOSIS — R739 Hyperglycemia, unspecified: Secondary | ICD-10-CM | POA: Diagnosis not present

## 2018-07-28 DIAGNOSIS — E611 Iron deficiency: Secondary | ICD-10-CM | POA: Diagnosis not present

## 2018-07-28 LAB — HEPATIC FUNCTION PANEL
ALBUMIN: 4.2 g/dL (ref 3.5–5.2)
ALK PHOS: 68 U/L (ref 39–117)
ALT: 14 U/L (ref 0–35)
AST: 14 U/L (ref 0–37)
BILIRUBIN DIRECT: 0.1 mg/dL (ref 0.0–0.3)
BILIRUBIN TOTAL: 1.1 mg/dL (ref 0.2–1.2)
Total Protein: 7.2 g/dL (ref 6.0–8.3)

## 2018-07-28 LAB — CBC WITH DIFFERENTIAL/PLATELET
Basophils Absolute: 0.1 10*3/uL (ref 0.0–0.1)
Basophils Relative: 1.4 % (ref 0.0–3.0)
EOS PCT: 2.6 % (ref 0.0–5.0)
Eosinophils Absolute: 0.2 10*3/uL (ref 0.0–0.7)
HCT: 40.6 % (ref 36.0–46.0)
Hemoglobin: 13.6 g/dL (ref 12.0–15.0)
LYMPHS ABS: 2.9 10*3/uL (ref 0.7–4.0)
Lymphocytes Relative: 35.4 % (ref 12.0–46.0)
MCHC: 33.5 g/dL (ref 30.0–36.0)
MCV: 88.4 fl (ref 78.0–100.0)
MONO ABS: 0.7 10*3/uL (ref 0.1–1.0)
MONOS PCT: 9 % (ref 3.0–12.0)
NEUTROS ABS: 4.2 10*3/uL (ref 1.4–7.7)
NEUTROS PCT: 51.6 % (ref 43.0–77.0)
PLATELETS: 316 10*3/uL (ref 150.0–400.0)
RBC: 4.59 Mil/uL (ref 3.87–5.11)
RDW: 13.7 % (ref 11.5–15.5)
WBC: 8.2 10*3/uL (ref 4.0–10.5)

## 2018-07-28 LAB — BASIC METABOLIC PANEL
BUN: 18 mg/dL (ref 6–23)
CHLORIDE: 102 meq/L (ref 96–112)
CO2: 29 mEq/L (ref 19–32)
CREATININE: 0.83 mg/dL (ref 0.40–1.20)
Calcium: 9.5 mg/dL (ref 8.4–10.5)
GFR: 72.01 mL/min (ref >60.00)
GLUCOSE: 123 mg/dL — AB (ref 70–99)
Potassium: 4.3 mEq/L (ref 3.5–5.1)
Sodium: 139 mEq/L (ref 135–145)

## 2018-07-28 LAB — HEMOGLOBIN A1C: Hgb A1c MFr Bld: 6.2 % (ref 4.6–6.5)

## 2018-07-28 LAB — LIPID PANEL
CHOL/HDL RATIO: 4
Cholesterol: 159 mg/dL (ref 0–200)
HDL: 44.6 mg/dL (ref >39.00)
LDL Cholesterol: 80 mg/dL (ref 0–99)
NonHDL: 114
TRIGLYCERIDES: 170 mg/dL — AB (ref 0.0–149.0)
VLDL: 34 mg/dL (ref 0.0–40.0)

## 2018-07-28 LAB — FERRITIN: FERRITIN: 30.2 ng/mL (ref 10.0–291.0)

## 2018-07-28 LAB — TSH: TSH: 3.15 u[IU]/mL (ref 0.35–4.50)

## 2018-07-29 ENCOUNTER — Encounter: Payer: Self-pay | Admitting: Internal Medicine

## 2018-07-30 ENCOUNTER — Ambulatory Visit (INDEPENDENT_AMBULATORY_CARE_PROVIDER_SITE_OTHER): Payer: Medicare HMO | Admitting: Internal Medicine

## 2018-07-30 ENCOUNTER — Encounter: Payer: Self-pay | Admitting: Internal Medicine

## 2018-07-30 VITALS — BP 118/68 | HR 68 | Temp 98.0°F | Resp 18 | Ht 63.0 in | Wt 202.0 lb

## 2018-07-30 DIAGNOSIS — Z8542 Personal history of malignant neoplasm of other parts of uterus: Secondary | ICD-10-CM | POA: Diagnosis not present

## 2018-07-30 DIAGNOSIS — G4730 Sleep apnea, unspecified: Secondary | ICD-10-CM

## 2018-07-30 DIAGNOSIS — E611 Iron deficiency: Secondary | ICD-10-CM

## 2018-07-30 DIAGNOSIS — R739 Hyperglycemia, unspecified: Secondary | ICD-10-CM | POA: Diagnosis not present

## 2018-07-30 DIAGNOSIS — Z0000 Encounter for general adult medical examination without abnormal findings: Secondary | ICD-10-CM | POA: Diagnosis not present

## 2018-07-30 DIAGNOSIS — I700 Atherosclerosis of aorta: Secondary | ICD-10-CM

## 2018-07-30 DIAGNOSIS — Z1231 Encounter for screening mammogram for malignant neoplasm of breast: Secondary | ICD-10-CM

## 2018-07-30 DIAGNOSIS — Z23 Encounter for immunization: Secondary | ICD-10-CM | POA: Diagnosis not present

## 2018-07-30 DIAGNOSIS — R945 Abnormal results of liver function studies: Secondary | ICD-10-CM

## 2018-07-30 DIAGNOSIS — I10 Essential (primary) hypertension: Secondary | ICD-10-CM

## 2018-07-30 DIAGNOSIS — E7800 Pure hypercholesterolemia, unspecified: Secondary | ICD-10-CM

## 2018-07-30 DIAGNOSIS — R7989 Other specified abnormal findings of blood chemistry: Secondary | ICD-10-CM

## 2018-07-30 DIAGNOSIS — E669 Obesity, unspecified: Secondary | ICD-10-CM

## 2018-07-30 MED ORDER — AZELASTINE HCL 0.1 % NA SOLN: 1.0000 | Freq: Two times a day (BID) | NASAL | 3 refills | Status: AC

## 2018-07-30 NOTE — Progress Notes (Signed)
Patient ID: Barbara Burns, female   DOB: 1946-06-24, 72 y.o.   MRN: [id number]   Subjective:    Patient ID: Barbara Burns, female    DOB: 04-12-1946, 72 y.o.   MRN: [id number]  HPI  Patient here for her physical exam.   Some pain in her left knee.  Has seen Dr Tamala Julian.  Trying to stay active.  Discussed diet and exercise.  No chest pain.  No sob.  No acid reflux.  Using cpap.  Some allergies.  Had been using flonase.  Would like to change.  Discussed astelin.  No chest pain.  No sob.  No acid reflux.  No abdominal pain.  Bowels moving.  On zetia.  Tolerating.  Cholesterol better.     Past Medical History:  Diagnosis Date  . Chicken pox   . Endometrial cancer [facility])    s/p hysterectomy - 2003  . GERD (gastroesophageal reflux disease)   . Hypercholesterolemia   . Hypertension    Past Surgical History:  Procedure Laterality Date  . ABDOMINAL HYSTERECTOMY     total, may 2003  . APPENDECTOMY     age 67  . COLONOSCOPY WITH PROPOFOL N/A 05/20/2015   Procedure: COLONOSCOPY WITH PROPOFOL;  Surgeon: Manya Silvas, MD;  Location: [facility] ENDOSCOPY;  Service: Endoscopy;  Laterality: N/A;  . OOPHORECTOMY     left, secondary to ovarian cyst   Family History  Problem Relation Age of Onset  . Sleep apnea Father        resulting right heart failure  . Heart disease Father   . Hypertension Father   . Lymphoma Mother        non hodgkins lymphoma  . Stroke Maternal Grandmother   . Alcohol abuse Maternal Grandfather   . Diabetes Paternal Grandmother    Social History   Socioeconomic History  . Marital status: Divorced    Spouse name: Not on file  . Number of children: Not on file  . Years of education: Not on file  . Highest education level: Not on file  Occupational History  . Not on file  Social Needs  . Financial resource strain: Not on file  . Food insecurity:    Worry: Not on file    Inability: Not on file  . Transportation needs:    Medical: Not on file    Non-medical:  Not on file  Tobacco Use  . Smoking status: Former Smoker    Packs/day: 1.00    Years: 40.00    Pack years: 40.00    Types: Cigarettes    Last attempt to quit: 03/31/1978    Years since quitting: 40.3  . Smokeless tobacco: Never Used  Substance and Sexual Activity  . Alcohol use: No    Alcohol/week: 0.0 standard drinks  . Drug use: No  . Sexual activity: Never    Birth control/protection: Post-menopausal, Surgical  Lifestyle  . Physical activity:    Days per week: Not on file    Minutes per session: Not on file  . Stress: Not on file  Relationships  . Social connections:    Talks on phone: Not on file    Gets together: Not on file    Attends religious service: Not on file    Active member of club or organization: Not on file    Attends meetings of clubs or organizations: Not on file    Relationship status: Not on file  Other Topics Concern  . Not on file  Social  History Narrative  . Not on file    Outpatient Encounter Medications as of 07/30/2018  Medication Sig  . aspirin 81 MG tablet Take 81 mg by mouth daily.  Marland Kitchen azelastine (ASTELIN) 0.1 % nasal spray Place 1 spray into both nostrils 2 (two) times daily. Use in each nostril as directed  . Calcium Carbonate (CALCIUM 600 PO) Take 1 tablet by mouth 2 (two) times daily.  . Cholecalciferol (VITAMIN D3) 2000 UNITS TABS Take 2,000 Units by mouth daily.  Marland Kitchen co-enzyme Q-10 50 MG capsule Take 50 mg by mouth daily.  Marland Kitchen ezetimibe (ZETIA) 10 MG tablet Take 1 tablet (10 mg total) by mouth daily.  . fexofenadine-pseudoephedrine (ALLEGRA-D 24) 180-240 MG 24 hr tablet Take 1 tablet by mouth daily.  . fish oil-omega-3 fatty acids 1000 MG capsule Take 1 g by mouth daily.  . hydrochlorothiazide (MICROZIDE) 12.5 MG capsule TAKE 1 CAPSULE DAILY  . irbesartan-hydrochlorothiazide (AVALIDE) 150-12.5 MG tablet TAKE 1 TABLET DAILY  . omeprazole (PRILOSEC) 20 MG capsule Take 20 mg by mouth daily.  . Turmeric 500 MG CAPS Take 1 capsule by mouth  daily.  . [DISCONTINUED] Ferrous Sulfate (IRON) 325 (65 Fe) MG TABS Take 1 tablet by mouth daily.   No facility-administered encounter medications on file as of 07/30/2018.     Review of Systems  Constitutional: Negative for appetite change and unexpected weight change.  HENT: Negative for congestion and sinus pressure.   Eyes: Negative for pain and visual disturbance.  Respiratory: Negative for cough, chest tightness and shortness of breath.   Cardiovascular: Negative for chest pain, palpitations and leg swelling.  Gastrointestinal: Negative for abdominal pain, diarrhea and nausea.  Genitourinary: Negative for difficulty urinating and dysuria.  Musculoskeletal: Negative for joint swelling and myalgias.  Skin: Negative for color change and rash.  Neurological: Negative for dizziness, light-headedness and headaches.  Hematological: Negative for adenopathy. Does not bruise/bleed easily.  Psychiatric/Behavioral: Negative for agitation and dysphoric mood.       Objective:    Physical Exam  Constitutional: She is oriented to person, place, and time. She appears well-developed and well-nourished. No distress.  HENT:  Nose: Nose normal.  Mouth/Throat: Oropharynx is clear and moist.  Eyes: Right eye exhibits no discharge. Left eye exhibits no discharge. No scleral icterus.  Neck: Neck supple. No thyromegaly present.  Cardiovascular: Normal rate and regular rhythm.  Pulmonary/Chest: Breath sounds normal. No accessory muscle usage. No tachypnea. No respiratory distress. She has no decreased breath sounds. She has no wheezes. She has no rhonchi. Right breast exhibits no inverted nipple, no mass, no nipple discharge and no tenderness (no axillary adenopathy). Left breast exhibits no inverted nipple, no mass, no nipple discharge and no tenderness (no axilarry adenopathy).  Abdominal: Soft. Bowel sounds are normal. There is no tenderness.  Musculoskeletal: She exhibits no edema or tenderness.    Lymphadenopathy:    She has no cervical adenopathy.  Neurological: She is alert and oriented to person, place, and time.  Skin: No rash noted. No erythema.  Psychiatric: She has a normal mood and affect. Her behavior is normal.    BP 118/68 (BP Location: Left Arm, Patient Position: Sitting, Cuff Size: Large)   Pulse 68   Temp 98 F (36.7 C) (Oral)   Resp 18   Ht 5' 3"  (1.6 m)   Wt 202 lb (91.6 kg)   SpO2 98%   BMI 35.78 kg/m  Wt Readings from Last 3 Encounters:  07/30/18 202 lb (91.6 kg)  06/04/18  198 lb (89.8 kg)  04/02/18 198 lb 12 oz (90.2 kg)     Lab Results  Component Value Date   WBC 8.2 07/28/2018   HGB 13.6 07/28/2018   HCT 40.6 07/28/2018   PLT 316.0 07/28/2018   GLUCOSE 123 (H) 07/28/2018   CHOL 159 07/28/2018   TRIG 170.0 (H) 07/28/2018   HDL 44.60 07/28/2018   LDLDIRECT 154.1 08/11/2013   LDLCALC 80 07/28/2018   ALT 14 07/28/2018   AST 14 07/28/2018   NA 139 07/28/2018   K 4.3 07/28/2018   CL 102 07/28/2018   CREATININE 0.83 07/28/2018   BUN 18 07/28/2018   CO2 29 07/28/2018   TSH 3.15 07/28/2018   HGBA1C 6.2 07/28/2018   MICROALBUR <0.7 02/23/2015    Ct Cardiac Scoring  Addendum Date: 04/18/2018   ADDENDUM REPORT: 04/18/2018 14:42 EXAM: OVER-READ INTERPRETATION  CT CHEST The following report is an over-read performed by radiologist Dr. Eben Burow [facility] on 04/18/2018. This over-read does not include interpretation of cardiac or coronary anatomy or pathology. The coronary calcium CT interpretation by the cardiologist is attached. COMPARISON:  None. FINDINGS: The visualized portions of the lower lung fields show no suspicious nodules, masses, or infiltrates. The visualized portions of the mediastinum and chest wall are unremarkable. IMPRESSION: No significant non-cardiac abnormality in visualized portion of the thorax. Electronically Signed   By: Earle Gell M.D.   On: 04/18/2018 14:42   Result Date: 04/18/2018 TECHNIQUE: The  patient was scanned on a Siemens Somatom 64 slice scanner. Axial non-contrast 3 mm slices were carried out through the heart. The data set was analyzed on a dedicated work station and scored using the [place]. FINDINGS: Non-cardiac: See separate report from [facility]. Ascending Aorta: Mildly dilated root 3.8 cm Pericardium: Normal Coronary arteries: One isolated punctate area of calcium in mid LAD IMPRESSION: Coronary calcium score of 2 . This was 54 st percentile for age and sex matched control. Jenkins Rouge Electronically Signed: By: Jenkins Rouge M.D. On: 04/18/2018 13:53       Assessment & Plan:   Problem List Items Addressed This Visit    Abnormal liver function test    Low cholesterol diet and exercise.  Follow liver panel.        Aortic atherosclerosis ([place])    On zetia.        Health care maintenance    Physical today 07/28/18.  mammogram 09/19/17- Birads I.  Schedule mammogram.  Colonoscopy 04/2015.        History of endometrial cancer    Has been followed by Dr Delsa Sale.  Plans to f/u with gyn.        Hypercholesterolemia    She declined statin medication.  Started on zetia.  Tolerating.  Cholesterol better.        Relevant Orders   Hepatic function panel   Lipid panel   Hyperglycemia    Low carb diet and exercise.  Follow met b and a1c.        Relevant Orders   Hemoglobin Z9D   Basic metabolic panel   Hypertension    Blood pressure under good control.  Continue same medication regimen.  Follow pressures.  Follow metabolic panel.        Iron deficiency    Discussed GI evaluation.  She declines.  Had colonoscopy 04/2015.        Relevant Orders   Ferritin   CBC with Differential/Platelet   Obesity (BMI 30-39.9)  Discussed diet and exercise.  Follow.        Sleep apnea    Using cpap regularly.  Follow.         Other Visit Diagnoses    Routine general medical examination at a health care facility    -  Primary   Visit for  screening mammogram       Relevant Orders   MM 3D SCREEN BREAST BILATERAL   Encounter for immunization       Relevant Orders   Flu vaccine HIGH DOSE PF (Completed)       Einar Pheasant, MD

## 2018-08-02 ENCOUNTER — Encounter: Payer: Self-pay | Admitting: Internal Medicine

## 2018-08-02 NOTE — Assessment & Plan Note (Signed)
She declined statin medication.  Started on zetia.  Tolerating.  Cholesterol better.

## 2018-08-02 NOTE — Assessment & Plan Note (Signed)
Discussed GI evaluation.  She declines.  Had colonoscopy 04/2015.

## 2018-08-02 NOTE — Assessment & Plan Note (Signed)
Using cpap regularly.  Follow.  

## 2018-08-02 NOTE — Assessment & Plan Note (Signed)
Blood pressure under good control.  Continue same medication regimen.  Follow pressures.  Follow metabolic panel.   

## 2018-08-02 NOTE — Assessment & Plan Note (Signed)
On zetia.  

## 2018-08-02 NOTE — Assessment & Plan Note (Signed)
Physical today 07/28/18.  mammogram 09/19/17- Birads I.  Schedule mammogram.  Colonoscopy 04/2015.

## 2018-08-02 NOTE — Assessment & Plan Note (Signed)
Low carb diet and exercise.  Follow met b and a1c.   

## 2018-08-02 NOTE — Assessment & Plan Note (Addendum)
Low cholesterol diet and exercise.  Follow liver panel.  

## 2018-08-02 NOTE — Assessment & Plan Note (Signed)
Has been followed by Dr Delsa Sale.  Plans to f/u with gyn.

## 2018-08-02 NOTE — Assessment & Plan Note (Signed)
Discussed diet and exercise.  Follow.  

## 2018-08-06 ENCOUNTER — Ambulatory Visit: Payer: Self-pay | Admitting: Internal Medicine

## 2018-08-16 DIAGNOSIS — G4733 Obstructive sleep apnea (adult) (pediatric): Secondary | ICD-10-CM | POA: Diagnosis not present

## 2018-08-16 DIAGNOSIS — I10 Essential (primary) hypertension: Secondary | ICD-10-CM | POA: Diagnosis not present

## 2018-08-24 DEATH — deceased

## 2018-09-03 ENCOUNTER — Ambulatory Visit: Payer: Self-pay | Admitting: Family Medicine

## 2018-12-03 ENCOUNTER — Other Ambulatory Visit: Payer: Self-pay

## 2018-12-05 ENCOUNTER — Ambulatory Visit: Payer: Self-pay | Admitting: Internal Medicine
